# Patient Record
Sex: Female | Born: 1980 | Race: Black or African American | Hispanic: No | Marital: Single | Smoking: Current every day smoker
Health system: Southern US, Community
[De-identification: ages and names within clinical notes are randomized; demographics above are authoritative.]

## PROBLEM LIST (undated history)

## (undated) DIAGNOSIS — F419 Anxiety disorder, unspecified: Secondary | ICD-10-CM

## (undated) DIAGNOSIS — T7840XA Allergy, unspecified, initial encounter: Secondary | ICD-10-CM

## (undated) DIAGNOSIS — D649 Anemia, unspecified: Secondary | ICD-10-CM

## (undated) DIAGNOSIS — Z8041 Family history of malignant neoplasm of ovary: Secondary | ICD-10-CM

## (undated) HISTORY — DX: Family history of malignant neoplasm of ovary: Z80.41

## (undated) HISTORY — DX: Allergy, unspecified, initial encounter: T78.40XA

## (undated) HISTORY — DX: Anemia, unspecified: D64.9

## (undated) HISTORY — PX: TUBAL LIGATION: SHX77

## (undated) HISTORY — DX: Anxiety disorder, unspecified: F41.9

---

## 2008-01-10 ENCOUNTER — Ambulatory Visit: Payer: Self-pay | Admitting: Internal Medicine

## 2009-03-10 LAB — CONVERTED CEMR LAB: Pap Smear: NORMAL

## 2009-10-04 ENCOUNTER — Ambulatory Visit: Payer: Self-pay | Admitting: Family Medicine

## 2009-10-04 DIAGNOSIS — F17200 Nicotine dependence, unspecified, uncomplicated: Secondary | ICD-10-CM | POA: Insufficient documentation

## 2009-10-04 DIAGNOSIS — Z862 Personal history of diseases of the blood and blood-forming organs and certain disorders involving the immune mechanism: Secondary | ICD-10-CM | POA: Insufficient documentation

## 2009-10-21 LAB — CONVERTED CEMR LAB
ALT: 17 units/L (ref 0–35)
AST: 16 units/L (ref 0–37)
Albumin: 4 g/dL (ref 3.5–5.2)
Alkaline Phosphatase: 60 units/L (ref 39–117)
BUN: 9 mg/dL (ref 6–23)
Basophils Absolute: 0 10*3/uL (ref 0.0–0.1)
Chloride: 107 meq/L (ref 96–112)
Eosinophils Relative: 3.5 % (ref 0.0–5.0)
GFR calc non Af Amer: 131.68 mL/min (ref >60)
Glucose, Bld: 83 mg/dL (ref 70–99)
HCT: 38.6 % (ref 36.0–46.0)
Hemoglobin: 13 g/dL (ref 12.0–15.0)
LDL Cholesterol: 110 mg/dL — ABNORMAL HIGH (ref 0–99)
Lymphs Abs: 3.5 10*3/uL (ref 0.7–4.0)
Monocytes Absolute: 0.6 10*3/uL (ref 0.1–1.0)
Monocytes Relative: 8.6 % (ref 3.0–12.0)
Neutro Abs: 2.7 10*3/uL (ref 1.4–7.7)
Neutrophils Relative %: 38.3 % — ABNORMAL LOW (ref 43.0–77.0)
Platelets: 183 10*3/uL (ref 150.0–400.0)
Potassium: 4.1 meq/L (ref 3.5–5.1)
Sodium: 143 meq/L (ref 135–145)
Total Bilirubin: 0.2 mg/dL — ABNORMAL LOW (ref 0.3–1.2)
Total Protein: 7.3 g/dL (ref 6.0–8.3)
VLDL: 30 mg/dL (ref 0.0–40.0)
WBC: 7.1 10*3/uL (ref 4.5–10.5)

## 2009-11-23 ENCOUNTER — Ambulatory Visit: Payer: Self-pay | Admitting: Family Medicine

## 2010-01-08 ENCOUNTER — Emergency Department: Payer: Self-pay | Admitting: Emergency Medicine

## 2010-04-07 ENCOUNTER — Encounter: Payer: Self-pay | Admitting: Maternal and Fetal Medicine

## 2010-05-24 NOTE — Assessment & Plan Note (Signed)
Summary: NEW PT TO EST/CLE   Vital Signs:  Patient profile:   30 year old female Height:      70 inches Weight:      196.50 pounds BMI:     28.30 Temp:     98.3 degrees F oral Pulse rate:   80 / minute Pulse rhythm:   regular BP sitting:   120 / 60  (left arm) Cuff size:   regular  Vitals Entered By: Linde Gillis CMA Duncan Dull) (October 04, 2009 1:56 PM) CC: new patient   History of Present Illness: 30 yo here to establish care with no complaints.  Well woman-  has not been to primary doctor in years. Has been having yearly pap smears at [facility].  Sexually active with husband only, has one son.  Intested in contraception, other than OCPs.  They all made her nauseated. Tried Depo, gained too much weight. Currently using condoms.  Tobacco abuse- 1/2 ppd x 10 years.  Has never quit before.  Ready to quit to set a good example for her son and she does not want him around cigarette smoke.  Preventive Screening-Counseling & Management  Alcohol-Tobacco     Smoking Status: current  Caffeine-Diet-Exercise     Does Patient Exercise: yes      Drug Use:  no.    Current Medications (verified): 1)  Chantix Starting Month Pak 0.5 Mg X 11 & 1 Mg X 42  Misc (Varenicline Tartrate) .... 0.5mg  By Mouth Once Daily For 3 Days, Then Twice Daily For 4 Days and Then 1mg  By Mouth 2 Times Daily  Allergies (verified): No Known Drug Allergies  Past History:  Family History: Last updated: 10/04/2009 Family History High cholesterol Family History Hypertension  Social History: Last updated: 10/04/2009 Lives in [place] with son and husband. 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 Family History High cholesterol Family History Hypertension  Social History: Reviewed history and no changes required. Lives in [place] with son and husband. 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 Eyes:  Denies blurring. ENT:  Denies difficulty swallowing. CV:  Denies chest pain or discomfort. Resp:  Denies shortness of breath. GI:  Denies abdominal pain, bloody stools, and change in bowel habits. GU:  Denies abnormal vaginal bleeding, discharge, and dysuria. MS:  Denies joint pain, joint redness, and joint swelling. Derm:  Denies rash. Neuro:  Denies headaches. Psych:  Denies anxiety, depression, sense of great danger, suicidal thoughts/plans, thoughts of violence, unusual visions or sounds, and thoughts /plans of harming others. Endo:  Denies cold intolerance and heat intolerance.  Physical Exam  General:  Well-developed,well-nourished,in no acute distress; alert,appropriate and cooperative throughout examination, overweight Eyes:  vision grossly intact, pupils equal, and pupils round.   Ears:  R ear normal and L ear normal.   Nose:  no external deformity.   Mouth:  good dentition.   Neck:  No deformities, masses, or tenderness noted. Lungs:  Normal respiratory effort, chest expands symmetrically. Lungs are clear to auscultation, no crackles or wheezes.  Heart:  Normal rate and regular rhythm. S1 and S2 normal without gallop, murmur, click, rub or other extra sounds. Abdomen:  Bowel sounds positive,abdomen soft and non-tender without masses, organomegaly or hernias noted. Msk:  No deformity or scoliosis noted of thoracic or lumbar spine.   Extremities:  No clubbing, cyanosis, edema, or deformity noted with normal full range of motion of all joints.     Neurologic:  alert & oriented X3 and gait normal.   Skin:  Intact without suspicious lesions or rashes Psych:  Cognition and judgment appear intact. Alert and cooperative with normal attention span and concentration. No apparent delusions, illusions, hallucinations   Impression & Recommendations:  Problem # 1:  HEALTH MAINTENANCE EXAM (ICD-V70.0) Assessment Unchanged Reviewed preventive care protocols, scheduled due services, and updated immunizations Discussed nutrition, exercise, diet, and healthy lifestyle.  BMET, FLP, hepatic panel today.    Orders: Venipuncture (84696) TLB-BMP (Basic Metabolic Panel-BMET) (80048-METABOL)  Problem # 2:  ANEMIA, HX OF (ICD-V12.3) check CBC today. Orders: Venipuncture (29528) TLB-CBC Platelet - w/Differential (85025-CBCD)  Problem # 3:  CONTRACEPTIVE MANAGEMENT (ICD-V25.09) Assessment: Unchanged Given handout for Mirena, she and her husband will discuss.  Problem # 4:  TOBACCO ABUSE (ICD-305.1) Assessment: Unchanged Ready to quit! Given prescription for chantix along with chantix booklet.  Follow up in 1 month. Her updated medication list for this problem includes:    Chantix Starting Month Pak 0.5 Mg X 11 & 1 Mg X 42 Misc (Varenicline tartrate) .Marland Kitchen... 0.5mg  by mouth once daily for 3 days, then twice daily for 4 days and then 1mg  by mouth 2 times daily  Complete Medication List: 1)  Chantix Starting Month Pak 0.5 Mg X 11 & 1 Mg X 42 Misc (Varenicline tartrate) .... 0.5mg  by mouth once daily for 3 days, then twice daily for 4 days and then 1mg  by mouth 2 times daily  Other Orders: TLB-Lipid Panel (80061-LIPID) TLB-Hepatic/Liver Function Pnl (80076-HEPATIC)  Patient Instructions: 1)  It was so nice to meet you. 2)  Please come back to see me in one month.  3)  Stop smoking tips: Choose a quit date. Cut down before the quit date. Decide what you will do as a substitute when you feel the urge to smoke(gum, toothpick, exercise).   Prescriptions: CHANTIX STARTING MONTH PAK 0.5 MG X 11 & 1 MG X 42  MISC (VARENICLINE TARTRATE) 0.5mg  by mouth once daily for 3 days, then twice daily for 4 days and then 1mg  by mouth 2 times daily  #1 pack x 0   Entered and Authorized by:   Ruthe Mannan MD   Signed by:   Ruthe Mannan MD on 10/04/2009   Method used:   Print then Give to Patient   RxID:   [id number]   Prior Medications (reviewed today): None Current Allergies (reviewed today): No known allergies   PAP Result Date:  03/10/2009 PAP Result:  normal PAP Next Due:  2 yr

## 2010-07-26 ENCOUNTER — Inpatient Hospital Stay: Payer: Self-pay | Admitting: Obstetrics and Gynecology

## 2010-08-02 LAB — PATHOLOGY REPORT

## 2010-12-28 ENCOUNTER — Encounter: Payer: Self-pay | Admitting: General Practice

## 2011-01-23 ENCOUNTER — Encounter: Payer: Self-pay | Admitting: General Practice

## 2013-06-25 ENCOUNTER — Ambulatory Visit: Payer: Self-pay | Admitting: Family Medicine

## 2014-12-31 ENCOUNTER — Encounter: Payer: Self-pay | Admitting: Family Medicine

## 2014-12-31 ENCOUNTER — Ambulatory Visit (INDEPENDENT_AMBULATORY_CARE_PROVIDER_SITE_OTHER): Payer: 59 | Admitting: Family Medicine

## 2014-12-31 VITALS — BP 116/72 | HR 79 | Temp 98.3°F | Resp 16 | Ht 69.0 in | Wt 208.8 lb

## 2014-12-31 DIAGNOSIS — I10 Essential (primary) hypertension: Secondary | ICD-10-CM | POA: Insufficient documentation

## 2014-12-31 DIAGNOSIS — IMO0002 Reserved for concepts with insufficient information to code with codable children: Secondary | ICD-10-CM | POA: Insufficient documentation

## 2014-12-31 DIAGNOSIS — S161XXA Strain of muscle, fascia and tendon at neck level, initial encounter: Secondary | ICD-10-CM | POA: Diagnosis not present

## 2014-12-31 DIAGNOSIS — A5901 Trichomonal vulvovaginitis: Secondary | ICD-10-CM | POA: Insufficient documentation

## 2014-12-31 MED ORDER — MELOXICAM 15 MG PO TABS: 15.0000 mg | ORAL_TABLET | Freq: Every day | ORAL | Status: DC

## 2014-12-31 NOTE — Patient Instructions (Signed)
Cold compresses for 20 minutes on your neck after work.

## 2014-12-31 NOTE — Progress Notes (Signed)
Subjective:     Patient ID: Anita Lynch, female   DOB: 02/07/1981, 34 y.o.   MRN: [id number]  HPI  Chief Complaint  Patient presents with  . Neck Pain    Patient comes in office today with concerns of pain on her right side of her neck. Patient describes pain as a dull ache that radiates down her arm, patient report symptom began this morning. Patient states yesterday she hand numbness and tingling in her right hand that has not subsided.   Now reports persistent numbness from her right posterior neck to her first and second fingers. Reports she first noticed this while combing her daughter's hair yesterday. Denis prior neck injury or surgery.   Review of Systems  Constitutional: Negative for fever and chills.       Objective:   Physical Exam  Constitutional: She appears well-developed and well-nourished. She appears distressed.  Musculoskeletal:  Grip/EE/EF/shoulder strength 5/5.Cervical and shoulder FROM but increased upper shoulder pain with internal/external rotation. Mild tenderness over her right posterior cervical/upper trapezius area       Assessment:    1. Cervical strain, initial encounter - meloxicam (MOBIC) 15 MG tablet; Take 1 tablet (15 mg total) by mouth daily.  Dispense: 15 tablet; Refill: 0    Plan:    Cold compresses after her work day. Consider imaging/prednisone if not improving.

## 2015-11-15 ENCOUNTER — Ambulatory Visit: Payer: Self-pay | Admitting: Registered Nurse

## 2015-11-15 DIAGNOSIS — K047 Periapical abscess without sinus: Secondary | ICD-10-CM

## 2015-11-15 DIAGNOSIS — S025XXB Fracture of tooth (traumatic), initial encounter for open fracture: Secondary | ICD-10-CM

## 2015-11-15 MED ORDER — AMOXICILLIN-POT CLAVULANATE 875-125 MG PO TABS: 1.0000 | ORAL_TABLET | Freq: Two times a day (BID) | ORAL | 0 refills | Status: AC

## 2015-11-15 NOTE — Progress Notes (Signed)
Subjective:    Patient ID: Anita Lynch, female    DOB: [birth year], 35 y.o.   MRN: [id number]  Single african [place] female here for evaluation of swelling and drainage around upper left incisor/premolar started this weekend; tooth fractured, bad taste in mouth, gums hot, cheek swollen   Abscess  This is a new problem. The current episode started in the past 7 days. The problem occurs constantly. The problem has been waxing and waning. Pertinent negatives include no abdominal pain, anorexia, arthralgias, change in bowel habit, chest pain, chills, congestion, coughing, diaphoresis, fatigue, fever, headaches, joint swelling, myalgias, nausea, neck pain, numbness, rash, sore throat, swollen glands, urinary symptoms, vertigo, visual change, vomiting or weakness. The symptoms are aggravated by eating. She has tried eating and heat for the symptoms. The treatment provided mild relief.      Review of Systems  Constitutional: Negative for chills, diaphoresis, fatigue and fever.  HENT: Positive for dental problem, ear pain, facial swelling and mouth sores. Negative for congestion, drooling, ear discharge, hearing loss, nosebleeds, postnasal drip, rhinorrhea, sinus pressure, sneezing, sore throat, tinnitus, trouble swallowing and voice change.   Eyes: Negative for photophobia, pain, discharge, redness, itching and visual disturbance.  Respiratory: Negative for cough.   Cardiovascular: Negative for chest pain.  Gastrointestinal: Negative for abdominal pain, anorexia, change in bowel habit, nausea and vomiting.  Endocrine: Negative for polydipsia, polyphagia and polyuria.  Genitourinary: Negative for difficulty urinating.  Musculoskeletal: Negative for arthralgias, joint swelling, myalgias, neck pain and neck stiffness.  Skin: Negative for color change, pallor, rash and wound.  Allergic/Immunologic: Positive for environmental allergies. Negative for food allergies.  Neurological: Negative for  dizziness, vertigo, tremors, seizures, syncope, facial asymmetry, speech difficulty, weakness, light-headedness, numbness and headaches.  Hematological: Negative for adenopathy. Does not bruise/bleed easily.  Psychiatric/Behavioral: Negative for sleep disturbance.       Objective:   Physical Exam  Constitutional: She is oriented to person, place, and time. She appears well-developed and well-nourished. She is active and cooperative.  Non-toxic appearance. She does not have a sickly appearance. She does not appear ill. No distress.  HENT:  Head: Normocephalic and atraumatic.  Right Ear: Hearing, external ear and ear canal normal. A middle ear effusion is present.  Left Ear: Hearing, external ear and ear canal normal. A middle ear effusion is present.  Nose: Mucosal edema and rhinorrhea present. No nose lacerations, sinus tenderness, nasal deformity, septal deviation or nasal septal hematoma. No epistaxis.  No foreign bodies. Right sinus exhibits no maxillary sinus tenderness and no frontal sinus tenderness. Left sinus exhibits no maxillary sinus tenderness and no frontal sinus tenderness.  Mouth/Throat: Uvula is midline and mucous membranes are normal. Mucous membranes are not pale, not dry and not cyanotic. She does not have dentures. No oral lesions. No trismus in the jaw. Normal dentition. Dental abscesses and dental caries present. No uvula swelling or lacerations. Posterior oropharyngeal edema and posterior oropharyngeal erythema present. No oropharyngeal exudate or tonsillar abscesses.    Cobblestoning posterior pharynx; bilateral TMs with air fluid level clear; bilateral nasal turbinates edema/erythema clear discharge; slight swelling left cheek 1+/4 nonpitting  Eyes: Conjunctivae, EOM and lids are normal. Pupils are equal, round, and reactive to light. Right eye exhibits no chemosis, no discharge, no exudate and no hordeolum. No foreign body present in the right eye. Left eye exhibits no  chemosis, no discharge, no exudate and no hordeolum. No foreign body present in the left eye. Right conjunctiva is not injected. Right conjunctiva  has no hemorrhage. Left conjunctiva is not injected. Left conjunctiva has no hemorrhage. No scleral icterus. Right eye exhibits normal extraocular motion and no nystagmus. Left eye exhibits normal extraocular motion and no nystagmus. Right pupil is round and reactive. Left pupil is round and reactive. Pupils are equal.  Neck: Trachea normal and normal range of motion. Neck supple. No tracheal tenderness, no spinous process tenderness and no muscular tenderness present. No neck rigidity. No tracheal deviation, no edema, no erythema and normal range of motion present. No thyroid mass and no thyromegaly present.  Cardiovascular: Normal rate, regular rhythm, S1 normal, S2 normal, normal heart sounds and intact distal pulses.  PMI is not displaced.  Exam reveals no gallop and no friction rub.   No murmur heard. Pulmonary/Chest: Effort normal and breath sounds normal. No accessory muscle usage or stridor. No respiratory distress. She has no decreased breath sounds. She has no wheezes. She has no rhonchi. She has no rales. She exhibits no tenderness.  Abdominal: Soft. Normal appearance. She exhibits no distension.  Musculoskeletal: Normal range of motion. She exhibits no edema or tenderness.       Right shoulder: Normal.       Left shoulder: Normal.       Right hip: Normal.       Left hip: Normal.       Right knee: Normal.       Left knee: Normal.       Cervical back: Normal.       Right hand: Normal.       Left hand: Normal.  Lymphadenopathy:       Head (right side): No submental, no submandibular, no tonsillar, no preauricular, no posterior auricular and no occipital adenopathy present.       Head (left side): No submental, no submandibular, no tonsillar, no preauricular, no posterior auricular and no occipital adenopathy present.    She has no cervical  adenopathy.       Right cervical: No superficial cervical, no deep cervical and no posterior cervical adenopathy present.      Left cervical: No superficial cervical, no deep cervical and no posterior cervical adenopathy present.  Neurological: She is alert and oriented to person, place, and time. She has normal strength. She is not disoriented. She displays no atrophy and no tremor. No cranial nerve deficit or sensory deficit. She exhibits normal muscle tone. She displays no seizure activity. Coordination and gait normal. GCS eye subscore is 4. GCS verbal subscore is 5. GCS motor subscore is 6.  Skin: Skin is warm, dry and intact. No abrasion, no bruising, no burn, no ecchymosis, no laceration, no lesion, no petechiae and no rash noted. She is not diaphoretic. No cyanosis or erythema. No pallor. Nails show no clubbing.  Psychiatric: She has a normal mood and affect. Her speech is normal and behavior is normal. Judgment and thought content normal. Cognition and memory are normal.  Nursing note and vitals reviewed.         Assessment & Plan:  A-tooth fracture, dental abscess  P-Take antibiotic as ordered augmentin 875mg  po BID x 10 days.  May take mobic 15mg  po daily she has at home OR OTC NSAID po TID/QID  Not both within 24 hours of each other.. Salt water gargles po prn.  Good dental hygiene brushing, flossing, listerine equivalent mouthwash BID.  Follow up with PCM/dentist if no improvement or worsening of symptoms e.g. fever, chills, dyspnea, SOB, purulent discharge, neck pain, eye pain.  Patient  verbalized understanding of information/instructions agreed with plan of care and had no further questions at this time.

## 2016-05-11 ENCOUNTER — Emergency Department
Admission: EM | Admit: 2016-05-11 | Discharge: 2016-05-11 | Disposition: A | Discharge status: Home / Self Care | Payer: 59 | Attending: Emergency Medicine | Admitting: Emergency Medicine

## 2016-05-11 ENCOUNTER — Encounter: Payer: Self-pay | Admitting: Emergency Medicine

## 2016-05-11 DIAGNOSIS — M62830 Muscle spasm of back: Secondary | ICD-10-CM | POA: Insufficient documentation

## 2016-05-11 DIAGNOSIS — Y999 Unspecified external cause status: Secondary | ICD-10-CM | POA: Diagnosis not present

## 2016-05-11 DIAGNOSIS — F17210 Nicotine dependence, cigarettes, uncomplicated: Secondary | ICD-10-CM | POA: Insufficient documentation

## 2016-05-11 DIAGNOSIS — Y9389 Activity, other specified: Secondary | ICD-10-CM | POA: Insufficient documentation

## 2016-05-11 DIAGNOSIS — Y929 Unspecified place or not applicable: Secondary | ICD-10-CM | POA: Insufficient documentation

## 2016-05-11 DIAGNOSIS — Z791 Long term (current) use of non-steroidal anti-inflammatories (NSAID): Secondary | ICD-10-CM | POA: Insufficient documentation

## 2016-05-11 DIAGNOSIS — X501XXA Overexertion from prolonged static or awkward postures, initial encounter: Secondary | ICD-10-CM | POA: Insufficient documentation

## 2016-05-11 DIAGNOSIS — S3992XA Unspecified injury of lower back, initial encounter: Secondary | ICD-10-CM | POA: Diagnosis present

## 2016-05-11 MED ORDER — NAPROXEN 500 MG PO TABS
500.0000 mg | ORAL_TABLET | Freq: Two times a day (BID) | ORAL | 0 refills | Status: DC
Start: 2016-05-11 — End: 2017-05-02

## 2016-05-11 MED ORDER — TIZANIDINE HCL 4 MG PO TABS
4.0000 mg | ORAL_TABLET | Freq: Three times a day (TID) | ORAL | 0 refills | Status: DC
Start: 2016-05-11 — End: 2017-05-02

## 2016-05-11 MED ORDER — ORPHENADRINE CITRATE 30 MG/ML IJ SOLN
60.0000 mg | Freq: Two times a day (BID) | INTRAMUSCULAR | Status: DC
  Administered 2016-05-11: 60 mg via INTRAMUSCULAR
  Filled 2016-05-11: qty 2

## 2016-05-11 MED ORDER — NAPROXEN 500 MG PO TABS
500.0000 mg | ORAL_TABLET | Freq: Once | ORAL | Status: AC
  Administered 2016-05-11: 500 mg via ORAL
  Filled 2016-05-11: qty 1

## 2016-05-11 NOTE — ED Triage Notes (Signed)
Pt presents to ED with c/o back pain. Pt states yesterday she bent down to get a shirt "and couldn't really get back up". Pt is ambulatory to the triage room with no difficulty at this time. NAD noted A & O x 4.

## 2016-05-11 NOTE — ED Notes (Signed)
Patient c/o lower right back pain radiating down right leg and to abdomen. Pt denies changes to urination. Patient she was leaning down when pain began picking up a Tshirt. Pt reports she took 1000 mg of ibuprofen at approx 1900 today with no relief of pain

## 2016-05-11 NOTE — ED Provider Notes (Signed)
[facility] Emergency Department Provider Note ____________________________________________  Time seen: Approximately 8:29 PM  I have reviewed the triage vital signs and the nursing notes.   HISTORY  Chief Complaint Back Pain    HPI Anita Lynch is a 36 y.o. female who presents to the emergency department for evaluation of right lower back pain with radiation into the right leg. He states that she bent over to pick something up out of the floor and had immediate pain and difficulty attempting to stand up. She took 1000 mg of ibuprofen at about 7 PM without any relief.  History reviewed. No pertinent past medical history.  Patient Active Problem List   Diagnosis Date Noted  . 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 Problems influencing health status 12/31/2014  . High blood pressure 12/31/2014  . TOBACCO ABUSE 10/04/2009  . ANEMIA, HX OF 10/04/2009    Past Surgical History:  Procedure Laterality Date  . CESAREAN SECTION  [phone]    Prior to Admission medications   Medication Sig Start Date End Date Taking? Authorizing Provider  meloxicam (MOBIC) 15 MG tablet Take 1 tablet (15 mg total) by mouth daily. 12/31/14   Carmon Ginsberg, PA  naproxen (NAPROSYN) 500 MG tablet Take 1 tablet (500 mg total) by mouth 2 (two) times daily with a meal. 05/11/16   Dierre Crevier B Analyce Tavares, FNP  tiZANidine (ZANAFLEX) 4 MG tablet Take 1 tablet (4 mg total) by mouth 3 (three) times daily. 05/11/16   Victorino Dike, FNP    Allergies Bee venom and Shellfish allergy  Family History  Problem Relation Age of Onset  . Hyperlipidemia Father   . Hypertension Father   . Lupus Sister   . Cancer Maternal Grandmother     breast  . Diabetes Paternal Grandmother   . Dementia Paternal Grandfather   . Alzheimer's disease Paternal Grandfather     Social History Social History  Substance Use Topics  . Smoking status: Current Every Day Smoker    Types: Cigarettes  . Smokeless tobacco:  Not on file  . Alcohol use No    Review of Systems Constitutional: No recent illness. Cardiovascular: Denies chest pain or palpitations. Respiratory: Denies shortness of breath. Musculoskeletal: Pain in Right lower back pain with radiation into the posterior right lower extremity and some radiation over into the right side of the abdomen. Skin: Negative for rash, wound, lesion. Neurological: Negative for focal weakness or numbness. Negative for loss of bowel or bladder control  ____________________________________________   PHYSICAL EXAM:  VITAL SIGNS: ED Triage Vitals  Enc Vitals Group     BP 05/11/16 1948 (!) 148/86     Pulse Rate 05/11/16 1948 87     Resp 05/11/16 1948 18     Temp 05/11/16 1948 97.9 F (36.6 C)     Temp Source 05/11/16 1948 Oral     SpO2 05/11/16 1948 100 %     Weight 05/11/16 1948 202 lb (91.6 kg)     Height 05/11/16 1948 5\' 9"  (1.753 m)     Head Circumference --      Peak Flow --      Pain Score 05/11/16 1949 7     Pain Loc --      Pain Edu? --      Excl. in [place]? --     Constitutional: Alert and oriented. Well appearing and in no acute distress. Eyes: Conjunctivae are normal. EOMI. Head: Atraumatic. Neck: No stridor.  Respiratory: Normal respiratory effort.  Musculoskeletal: Negative straight leg raise. Limited flexion. Full extension possible without pain. Neurologic:  Normal speech and language. No gross focal neurologic deficits are appreciated. Speech is normal. No gait instability. Skin:  Skin is warm, dry and intact. Atraumatic. Psychiatric: Mood and affect are normal. Speech and behavior are normal.  ____________________________________________   LABS (all labs ordered are listed, but only abnormal results are displayed)  Labs Reviewed - No data to display ____________________________________________  RADIOLOGY  Not indicated ____________________________________________   PROCEDURES  Procedure(s) performed:  None   ____________________________________________   INITIAL IMPRESSION / ASSESSMENT AND PLAN / ED COURSE     Pertinent labs & imaging results that were available during my care of the patient were reviewed by me and considered in my medical decision making (see chart for details).  36 year old female presenting to the emergency department for acute lumbar pain with radiation into the right lower extremity. While in the emergency department tonight, she was treated with Norflex and Naprosyn with significant relief. She was given prescriptions for Naprosyn and tizanidine. She was encouraged to follow up with her primary care provider for symptoms that are not improving over the next 2 days. She is also encouraged to return to the emergency department for symptoms that change or worsen if some unable schedule an appointment. ____________________________________________   FINAL CLINICAL IMPRESSION(S) / ED DIAGNOSES  Final diagnoses:  Muscle spasm of back       Victorino Dike, FNP 05/16/16 [place], MD 05/16/16 1447

## 2016-05-11 NOTE — Discharge Instructions (Signed)
Follow-up with your primary care provider for symptoms that are not improving over the next few days. Return to the emergency department for symptoms that change or worsen if you are unable to schedule an appointment.

## 2016-08-10 ENCOUNTER — Encounter: Payer: Self-pay | Admitting: Obstetrics & Gynecology

## 2016-08-10 ENCOUNTER — Ambulatory Visit (INDEPENDENT_AMBULATORY_CARE_PROVIDER_SITE_OTHER): Payer: 59 | Admitting: Obstetrics & Gynecology

## 2016-08-10 VITALS — BP 122/78 | HR 85 | Ht 69.0 in | Wt 216.0 lb

## 2016-08-10 DIAGNOSIS — Z13220 Encounter for screening for lipoid disorders: Secondary | ICD-10-CM

## 2016-08-10 DIAGNOSIS — Z0000 Encounter for general adult medical examination without abnormal findings: Secondary | ICD-10-CM

## 2016-08-10 DIAGNOSIS — Z1321 Encounter for screening for nutritional disorder: Secondary | ICD-10-CM

## 2016-08-10 DIAGNOSIS — Z131 Encounter for screening for diabetes mellitus: Secondary | ICD-10-CM | POA: Diagnosis not present

## 2016-08-10 DIAGNOSIS — G4489 Other headache syndrome: Secondary | ICD-10-CM | POA: Diagnosis not present

## 2016-08-10 DIAGNOSIS — Z1329 Encounter for screening for other suspected endocrine disorder: Secondary | ICD-10-CM | POA: Diagnosis not present

## 2016-08-10 MED ORDER — MEDROXYPROGESTERONE ACETATE 150 MG/ML IM SUSP: 150.0000 mg | INTRAMUSCULAR | 3 refills | Status: DC

## 2016-08-10 NOTE — Progress Notes (Signed)
HPI:      [name] is a 36 y.o. [id number] who LMP was Patient's last menstrual period was 07/25/2016., she presents today for her annual examination. The patient has no complaints today. The patient is sexually active. Her last pap: was normal and it was 3 years ago. The patient does perform self breast exams.  There is notable family history of breast or ovarian cancer in her family.  The patient has regular exercise: yes.  The patient denies current symptoms of depression.  Also c/o cyclcical pre-menstrual headache, no other neurologic signs.  GYN History: Contraception: tubal ligation  PMHx: No past medical history on file. Past Surgical History:  Procedure Laterality Date  . CESAREAN SECTION  2002,2012   Family History  Problem Relation Age of Onset  . Hyperlipidemia Father   . Hypertension Father   . Lupus Sister   . Cancer Maternal Grandmother     breast  . Diabetes Paternal Grandmother   . Dementia Paternal Grandfather   . Alzheimer's disease Paternal Grandfather    Social History  Substance Use Topics  . Smoking status: Current Every Day Smoker    Types: Cigarettes  . Smokeless tobacco: Never Used  . Alcohol use No    Current Outpatient Prescriptions:  .  medroxyPROGESTERone (DEPO-PROVERA) 150 MG/ML injection, Inject 1 mL (150 mg total) into the muscle every 3 (three) months., Disp: 1 mL, Rfl: 3 .  meloxicam (MOBIC) 15 MG tablet, Take 1 tablet (15 mg total) by mouth daily. (Patient not taking: Reported on 08/10/2016), Disp: 15 tablet, Rfl: 0 .  naproxen (NAPROSYN) 500 MG tablet, Take 1 tablet (500 mg total) by mouth 2 (two) times daily with a meal. (Patient not taking: Reported on 08/10/2016), Disp: 30 tablet, Rfl: 0 .  tiZANidine (ZANAFLEX) 4 MG tablet, Take 1 tablet (4 mg total) by mouth 3 (three) times daily. (Patient not taking: Reported on 08/10/2016), Disp: 30 tablet, Rfl: 0 Allergies: Bee venom and Shellfish allergy  Review of Systems    Constitutional: Negative for chills, fever and malaise/fatigue.  HENT: Negative for congestion, sinus pain and sore throat.   Eyes: Negative for blurred vision and pain.  Respiratory: Negative for cough and wheezing.   Cardiovascular: Negative for chest pain and leg swelling.  Gastrointestinal: Negative for abdominal pain, constipation, diarrhea, heartburn, nausea and vomiting.  Genitourinary: Negative for dysuria, frequency, hematuria and urgency.  Musculoskeletal: Negative for back pain, joint pain, myalgias and neck pain.  Skin: Negative for itching and rash.  Neurological: Negative for dizziness, tremors and weakness.  Endo/Heme/Allergies: Does not bruise/bleed easily.  Psychiatric/Behavioral: Negative for depression. The patient is not nervous/anxious and does not have insomnia.     Objective: BP 122/78   Pulse 85   Ht 5\' 9"  (1.753 m)   Wt 216 lb (98 kg)   LMP 07/25/2016   BMI 31.90 kg/m   Filed Weights   08/10/16 1008  Weight: 216 lb (98 kg)   Body mass index is 31.9 kg/m. Physical Exam  Constitutional: She is oriented to person, place, and time. She appears well-developed and well-nourished. No distress.  Genitourinary: Rectum normal, vagina normal and uterus normal. Pelvic exam was performed with patient supine. There is no rash or lesion on the right labia. There is no rash or lesion on the left labia. Vagina exhibits no lesion. No bleeding in the vagina. Right adnexum does not display mass and does not display tenderness. Left adnexum does not display mass and does not  display tenderness. Cervix does not exhibit motion tenderness, lesion, friability or polyp.   Uterus is mobile and midaxial. Uterus is not enlarged or exhibiting a mass.  HENT:  Head: Normocephalic and atraumatic. Head is without laceration.  Right Ear: Hearing normal.  Left Ear: Hearing normal.  Nose: No epistaxis.  No foreign bodies.  Mouth/Throat: Uvula is midline, oropharynx is clear and moist and  mucous membranes are normal.  Eyes: Pupils are equal, round, and reactive to light.  Neck: Normal range of motion. Neck supple. No thyromegaly present.  Cardiovascular: Normal rate and regular rhythm.  Exam reveals no gallop and no friction rub.   No murmur heard. Pulmonary/Chest: Effort normal and breath sounds normal. No respiratory distress. She has no wheezes. Right breast exhibits no mass, no skin change and no tenderness. Left breast exhibits no mass, no skin change and no tenderness.  Abdominal: Soft. Bowel sounds are normal. She exhibits no distension. There is no tenderness. There is no rebound.  Musculoskeletal: Normal range of motion.  Neurological: She is alert and oriented to person, place, and time. No cranial nerve deficit.  Skin: Skin is warm and dry.  Psychiatric: She has a normal mood and affect. Judgment normal.  Vitals reviewed.   Assessment:  ANNUAL EXAM 1. Annual physical exam   2. Headache associated with hormonal factors   3. Screening for diabetes mellitus   4. Screening for thyroid disorder   5. Screening for cholesterol level   6. Encounter for vitamin deficiency screening      Screening Plan:            1.  Cervical Screening-  Pap smear done today  2. Breast screening- Exam annually and mammogram>40 planned   3. Colonoscopy every 10 years, Hemoccult testing - after age 35  4. Labs Ordered today  5. Counseling for contraception: has BTL, but wants hormones to help w cyclical headaches  Other:  1. Annual physical exam - IGP, Aptima HPV  2. Headache associated with hormonal factors - medroxyPROGESTERone (DEPO-PROVERA) 150 MG/ML injection; Inject 1 mL (150 mg total) into the muscle every 3 (three) months.  Dispense: 1 mL; Refill: 3 - pros and cons and SE discussed  3. Screening for diabetes mellitus - Hemoglobin A1c; Future  4. Screening for thyroid disorder - TSH; Future  5. Screening for cholesterol level - Lipid panel; Future  6.  Encounter for vitamin deficiency screening - Vitamin D (25 hydroxy); Future    F/U  Return in about 1 year (around 08/10/2017) for Annual.  Soon for labs and Depo shot  Barnett Applebaum, MD, [facility] 08/10/2016  10:49 AM

## 2016-08-10 NOTE — Patient Instructions (Signed)

## 2016-08-14 ENCOUNTER — Ambulatory Visit (INDEPENDENT_AMBULATORY_CARE_PROVIDER_SITE_OTHER): Payer: 59

## 2016-08-14 ENCOUNTER — Other Ambulatory Visit: Payer: 59

## 2016-08-14 DIAGNOSIS — Z1329 Encounter for screening for other suspected endocrine disorder: Secondary | ICD-10-CM

## 2016-08-14 DIAGNOSIS — Z13220 Encounter for screening for lipoid disorders: Secondary | ICD-10-CM

## 2016-08-14 DIAGNOSIS — G43839 Menstrual migraine, intractable, without status migrainosus: Secondary | ICD-10-CM

## 2016-08-14 DIAGNOSIS — Z3041 Encounter for surveillance of contraceptive pills: Secondary | ICD-10-CM

## 2016-08-14 DIAGNOSIS — Z131 Encounter for screening for diabetes mellitus: Secondary | ICD-10-CM

## 2016-08-14 DIAGNOSIS — Z1321 Encounter for screening for nutritional disorder: Secondary | ICD-10-CM | POA: Diagnosis not present

## 2016-08-14 LAB — IGP, APTIMA HPV
HPV Aptima: NEGATIVE
PAP SMEAR COMMENT: 0

## 2016-08-14 MED ORDER — MEDROXYPROGESTERONE ACETATE 150 MG/ML IM SUSP
150.0000 mg | Freq: Once | INTRAMUSCULAR | Status: AC
  Administered 2016-08-14: 150 mg via INTRAMUSCULAR

## 2016-08-14 NOTE — Progress Notes (Signed)
NDC#[address]-2 

## 2016-08-15 ENCOUNTER — Encounter: Payer: Self-pay | Admitting: Obstetrics & Gynecology

## 2016-08-15 LAB — LIPID PANEL
Chol/HDL Ratio: 5.1 ratio — ABNORMAL HIGH (ref 0.0–4.4)
Cholesterol, Total: 154 mg/dL (ref 100–199)
HDL: 30 mg/dL — AB (ref >39)
LDL Calculated: 96 mg/dL (ref 0–99)
TRIGLYCERIDES: 138 mg/dL (ref 0–149)
VLDL Cholesterol Cal: 28 mg/dL (ref 5–40)

## 2016-08-15 LAB — VITAMIN D 25 HYDROXY (VIT D DEFICIENCY, FRACTURES): VIT D 25 HYDROXY: 16.9 ng/mL — AB (ref 30.0–100.0)

## 2016-08-15 LAB — TSH: TSH: 1.25 u[IU]/mL (ref 0.450–4.500)

## 2016-08-15 LAB — HEMOGLOBIN A1C
Est. average glucose Bld gHb Est-mCnc: 111 mg/dL
Hgb A1c MFr Bld: 5.5 % (ref 4.8–5.6)

## 2016-09-06 ENCOUNTER — Encounter: Payer: Self-pay | Admitting: Obstetrics & Gynecology

## 2016-09-11 ENCOUNTER — Ambulatory Visit: Payer: Self-pay | Admitting: Physician Assistant

## 2016-09-11 ENCOUNTER — Encounter: Payer: Self-pay | Admitting: Physician Assistant

## 2016-09-11 VITALS — BP 130/80 | HR 102 | Temp 98.8°F

## 2016-09-11 DIAGNOSIS — J209 Acute bronchitis, unspecified: Secondary | ICD-10-CM

## 2016-09-11 MED ORDER — IPRATROPIUM-ALBUTEROL 0.5-2.5 (3) MG/3ML IN SOLN: 3.0000 mL | Freq: Once | RESPIRATORY_TRACT | Status: DC

## 2016-09-11 MED ORDER — METHYLPREDNISOLONE 4 MG PO TBPK: ORAL_TABLET | ORAL | 0 refills | Status: DC

## 2016-09-11 MED ORDER — ALBUTEROL SULFATE HFA 108 (90 BASE) MCG/ACT IN AERS: 2.0000 | INHALATION_SPRAY | Freq: Four times a day (QID) | RESPIRATORY_TRACT | 0 refills | Status: DC | PRN

## 2016-09-11 MED ORDER — AZITHROMYCIN 250 MG PO TABS: ORAL_TABLET | ORAL | 0 refills | Status: DC

## 2016-09-11 NOTE — Progress Notes (Signed)
S: C/o cough and congestion with wheezing and chest pain, chest is sore from coughing, denies fever, chills, some white mucus but mainly cough is dry and hacking; ribs hurt from coughing, keeping pt awake at night;  denies cardiac type chest pain or sob, v/d, abd pain Remainder ros neg  O: vitals wnl, nad, tms clear, throat injected, neck supple no lymph, lungs with wheezing, cv rrr, neuro intact, svn duoneb given in clinic, air movement improved  A:  Acute bronchitis   P:  rx medication: medrol dose pack, zpack, albuterol inhaler, smoking cessation, use otc meds, tylenol or motrin as needed for fever/chills, return if not better in 3 -5 days, return earlier if worsening

## 2016-11-06 ENCOUNTER — Ambulatory Visit (INDEPENDENT_AMBULATORY_CARE_PROVIDER_SITE_OTHER): Payer: 59

## 2016-11-06 DIAGNOSIS — Z3042 Encounter for surveillance of injectable contraceptive: Secondary | ICD-10-CM | POA: Diagnosis not present

## 2016-11-06 MED ORDER — MEDROXYPROGESTERONE ACETATE 150 MG/ML IM SUSP
150.0000 mg | Freq: Once | INTRAMUSCULAR | Status: AC
  Administered 2016-11-06: 150 mg via INTRAMUSCULAR

## 2017-01-29 ENCOUNTER — Ambulatory Visit (INDEPENDENT_AMBULATORY_CARE_PROVIDER_SITE_OTHER): Payer: 59

## 2017-01-29 DIAGNOSIS — Z3042 Encounter for surveillance of injectable contraceptive: Secondary | ICD-10-CM

## 2017-01-29 MED ORDER — MEDROXYPROGESTERONE ACETATE 150 MG/ML IM SUSP
150.0000 mg | Freq: Once | INTRAMUSCULAR | Status: AC
  Administered 2017-01-29: 150 mg via INTRAMUSCULAR

## 2017-04-18 ENCOUNTER — Ambulatory Visit: Payer: Self-pay | Admitting: Emergency Medicine

## 2017-04-18 VITALS — BP 130/80 | HR 106 | Temp 98.7°F

## 2017-04-18 DIAGNOSIS — W57XXXA Bitten or stung by nonvenomous insect and other nonvenomous arthropods, initial encounter: Secondary | ICD-10-CM

## 2017-04-18 NOTE — Progress Notes (Signed)
Subjective. Approximate 2-1/2 weeks ago patient noted bite-like areas on the left upper abdomen. Since that time she has developed tender bite-like areas over her left deltoid and upper arm. Her 36-year-old child at home and husband do not have any bite-like areas. 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 She denies any lesions of her hands and feet. She is not ill. All of the areas have a small blister at the center. Objective. There are 4 approximate 1 x 1 cm firm areas with central puncture site over the left deltoid. There are also left upper abdomen are very similar lesions present. The rest of her scan is normal. There are no lesions between the fingers are between the toes or over her shins. Assessment. The patient definitely appears to have bite-like areas over her left shoulder and abdomen. These do not itch at night but they are warm to touch. They are in an unusual distribution to be scabies. It also is unusual because her family members do not have any lesions. 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 Plan. We'll go ahead and treat with Elimite tonight and repeat in one week. Her daughter will also be treated. She will check the patient's rooms thoroughly to see if she sees any signs of bedbugs or scabies. They may have to have an inspector go to the homes and check them to see if there is any evidence of bedbugs infestation.

## 2017-04-23 ENCOUNTER — Ambulatory Visit (INDEPENDENT_AMBULATORY_CARE_PROVIDER_SITE_OTHER): Payer: 59

## 2017-04-23 DIAGNOSIS — Z3042 Encounter for surveillance of injectable contraceptive: Secondary | ICD-10-CM

## 2017-04-23 MED ORDER — MEDROXYPROGESTERONE ACETATE 150 MG/ML IM SUSP
150.0000 mg | Freq: Once | INTRAMUSCULAR | Status: AC
  Administered 2017-04-23: 150 mg via INTRAMUSCULAR

## 2017-05-02 ENCOUNTER — Encounter: Payer: Self-pay | Admitting: Family Medicine

## 2017-05-02 ENCOUNTER — Ambulatory Visit (INDEPENDENT_AMBULATORY_CARE_PROVIDER_SITE_OTHER): Payer: 59 | Admitting: Family Medicine

## 2017-05-02 ENCOUNTER — Other Ambulatory Visit: Payer: Self-pay | Admitting: Family Medicine

## 2017-05-02 VITALS — BP 122/70 | HR 79 | Temp 98.6°F | Resp 15 | Ht 68.5 in | Wt 217.4 lb

## 2017-05-02 DIAGNOSIS — Z23 Encounter for immunization: Secondary | ICD-10-CM

## 2017-05-02 DIAGNOSIS — F17200 Nicotine dependence, unspecified, uncomplicated: Secondary | ICD-10-CM

## 2017-05-02 NOTE — Patient Instructions (Addendum)
Discussed trying Wellbutrin (bupropion) or over the counter nicotine products. Let me know if you want to try this medication. Take 800 units of Vitamin D daily.

## 2017-05-02 NOTE — Progress Notes (Signed)
Subjective:     Patient ID: Anita Lynch, female   DOB: 1980/05/20, 37 y.o.   MRN: [id number] Chief Complaint  Patient presents with  . Annual Exam    Patient comes in office for her annual physical she states that she feels well today and has no conerns or complaints to address. Pateint reports following a well balanced diet, she is not actively exercising and is sleeping on average 7hrs a night. Patient last reported pap smear was 08/10/16 HPV screen was negatie. Patient is due today to update her Tetnaus vaccine.    HPI She had physical and labs per GYN in 07/2016 so will focus on specific issues today. Will update tetanus today (Tdap in 1969) and discuss smoking cessation. Currently at 5 cigarettes a day. Works as a Science writer.  Review of Systems     Objective:   Physical Exam  Constitutional: She appears well-developed and well-nourished. No distress.  pleasant  Psychiatric: She has a normal mood and affect. Her behavior is normal.       Assessment:    1. Need for tetanus booster Td today 2. TOBACCO ABUSE     Plan:   Patient will consider bupropion and nicotine products. Call if she wishes to start rx. Encouraged regular exercise.

## 2017-07-05 ENCOUNTER — Other Ambulatory Visit: Payer: Self-pay | Admitting: Obstetrics & Gynecology

## 2017-07-05 DIAGNOSIS — G4489 Other headache syndrome: Secondary | ICD-10-CM

## 2017-07-09 ENCOUNTER — Other Ambulatory Visit: Payer: Self-pay | Admitting: Obstetrics & Gynecology

## 2017-07-09 ENCOUNTER — Telehealth: Payer: Self-pay

## 2017-07-09 DIAGNOSIS — G4489 Other headache syndrome: Secondary | ICD-10-CM

## 2017-07-09 MED ORDER — MEDROXYPROGESTERONE ACETATE 150 MG/ML IM SUSY: 1.0000 mL | PREFILLED_SYRINGE | INTRAMUSCULAR | 0 refills | Status: DC

## 2017-07-09 NOTE — Telephone Encounter (Signed)
Pt needs depo refill.  Pt has appt Mon.  Pharm said she needed appt.  [phone]  Pt aware Monday's appt is for inj only.  She isn't due for annual until after 08/10/17 which she will sched when she comes in on Monday.

## 2017-07-16 ENCOUNTER — Ambulatory Visit (INDEPENDENT_AMBULATORY_CARE_PROVIDER_SITE_OTHER): Payer: 59

## 2017-07-16 DIAGNOSIS — Z308 Encounter for other contraceptive management: Secondary | ICD-10-CM

## 2017-07-16 MED ORDER — MEDROXYPROGESTERONE ACETATE 150 MG/ML IM SUSP
150.0000 mg | Freq: Once | INTRAMUSCULAR | Status: AC
  Administered 2017-07-16: 150 mg via INTRAMUSCULAR

## 2017-07-16 NOTE — Progress Notes (Signed)
Pt here for depo which was given IM right glut.  NDC# [id number]

## 2017-08-20 ENCOUNTER — Ambulatory Visit (INDEPENDENT_AMBULATORY_CARE_PROVIDER_SITE_OTHER): Payer: 59 | Admitting: Obstetrics & Gynecology

## 2017-08-20 ENCOUNTER — Encounter: Payer: Self-pay | Admitting: Obstetrics & Gynecology

## 2017-08-20 VITALS — BP 120/70 | HR 76 | Ht 70.0 in | Wt 211.0 lb

## 2017-08-20 DIAGNOSIS — Z0000 Encounter for general adult medical examination without abnormal findings: Secondary | ICD-10-CM | POA: Diagnosis not present

## 2017-08-20 DIAGNOSIS — G4489 Other headache syndrome: Secondary | ICD-10-CM | POA: Insufficient documentation

## 2017-08-20 DIAGNOSIS — E559 Vitamin D deficiency, unspecified: Secondary | ICD-10-CM

## 2017-08-20 DIAGNOSIS — Z862 Personal history of diseases of the blood and blood-forming organs and certain disorders involving the immune mechanism: Secondary | ICD-10-CM | POA: Diagnosis not present

## 2017-08-20 NOTE — Patient Instructions (Signed)
PAP every three years Labs recheck

## 2017-08-20 NOTE — Progress Notes (Signed)
HPI:      [name] is a 37 y.o. [phone] who LMP was No LMP recorded. Patient has had an injection., she presents today for her annual examination. The patient has no complaints today. The patient is sexually active. Her last pap: approximate date 2018 and was normal. The patient does perform self breast exams.  There is no notable family history of breast or ovarian cancer in her family.  The patient has regular exercise: yes.  The patient denies current symptoms of depression.  Tried DEPO for cycle thus headache control last year, much improved.   Occas spotting  GYN History: Contraception: tubal ligation  PMHx: History reviewed. No pertinent past medical history. Past Surgical History:  Procedure Laterality Date  . CESAREAN SECTION  2002,2012   Family History  Problem Relation Age of Onset  . Hyperlipidemia Father   . Hypertension Father   . Lupus Sister   . Cancer Maternal Grandmother        breast  . Diabetes Paternal Grandmother   . Dementia Paternal Grandfather   . Alzheimer's disease Paternal Grandfather    Social History   Tobacco Use  . Smoking status: Current Every Day Smoker    Types: Cigarettes  . Smokeless tobacco: Never Used  Substance Use Topics  . Alcohol use: No    Alcohol/week: 0.0 oz  . Drug use: No    Current Outpatient Medications:  .  medroxyPROGESTERone Acetate 150 MG/ML SUSY, Inject 1 mL (150 mg total) into the muscle every 3 (three) months., Disp: 1 mL, Rfl: 0 Allergies: Bee venom and Shellfish allergy  Review of Systems  Constitutional: Negative for chills, fever and malaise/fatigue.  HENT: Negative for congestion, sinus pain and sore throat.   Eyes: Negative for blurred vision and pain.  Respiratory: Negative for cough and wheezing.   Cardiovascular: Negative for chest pain and leg swelling.  Gastrointestinal: Negative for abdominal pain, constipation, diarrhea, heartburn, nausea and vomiting.  Genitourinary: Negative for  dysuria, frequency, hematuria and urgency.  Musculoskeletal: Negative for back pain, joint pain, myalgias and neck pain.  Skin: Negative for itching and rash.  Neurological: Positive for headaches. Negative for dizziness, tremors and weakness.  Endo/Heme/Allergies: Does not bruise/bleed easily.  Psychiatric/Behavioral: Negative for depression. The patient is not nervous/anxious and does not have insomnia.     Objective: BP 120/70   Pulse 76   Ht 5\' 10"  (1.778 m)   Wt 211 lb (95.7 kg)   BMI 30.28 kg/m   Filed Weights   08/20/17 0838  Weight: 211 lb (95.7 kg)   Body mass index is 30.28 kg/m. Physical Exam  Constitutional: She is oriented to person, place, and time. She appears well-developed and well-nourished. No distress.  Genitourinary: Rectum normal, vagina normal and uterus normal. Pelvic exam was performed with patient supine. There is no rash or lesion on the right labia. There is no rash or lesion on the left labia. Vagina exhibits no lesion. No bleeding in the vagina. Right adnexum does not display mass and does not display tenderness. Left adnexum does not display mass and does not display tenderness. Cervix does not exhibit motion tenderness, lesion, friability or polyp.   Uterus is mobile and midaxial. Uterus is not enlarged or exhibiting a mass.  HENT:  Head: Normocephalic and atraumatic. Head is without laceration.  Right Ear: Hearing normal.  Left Ear: Hearing normal.  Nose: No epistaxis.  No foreign bodies.  Mouth/Throat: Uvula is midline, oropharynx is clear and moist  and mucous membranes are normal.  Eyes: Pupils are equal, round, and reactive to light.  Neck: Normal range of motion. Neck supple. No thyromegaly present.  Cardiovascular: Normal rate and regular rhythm. Exam reveals no gallop and no friction rub.  No murmur heard. Pulmonary/Chest: Effort normal and breath sounds normal. No respiratory distress. She has no wheezes. Right breast exhibits no mass, no  skin change and no tenderness. Left breast exhibits no mass, no skin change and no tenderness.  Abdominal: Soft. Bowel sounds are normal. She exhibits no distension. There is no tenderness. There is no rebound.  Musculoskeletal: Normal range of motion.  Neurological: She is alert and oriented to person, place, and time. No cranial nerve deficit.  Skin: Skin is warm and dry.  Psychiatric: She has a normal mood and affect. Judgment normal.  Vitals reviewed.   Assessment:  ANNUAL EXAM 1. Annual physical exam   2. Headache associated with hormonal factors   3. Vitamin D deficiency   4. History of anemia    Screening Plan:            1.  Cervical Screening-  Pap smear schedule reviewed with patient  2. Breast screening- Exam annually and mammogram>40 planned   3. Labs Last year normal except Vit D  4. Counseling for contraception: bilateral tubal ligation  Other:  1. Annual physical exam  2. Headache associated with hormonal factors Cont Depo.  Risks of osteoporosis discussed  3. Vitamin D deficiency Recheck level  4. History of anemia, also recheck levels    F/U  Return in about 1 year (around 08/21/2018) for Annual.  Barnett Applebaum, MD, [facility], [facility] 08/20/2017  9:00 AM

## 2017-08-21 LAB — CBC WITH DIFFERENTIAL
BASOS ABS: 0.1 10*3/uL (ref 0.0–0.2)
Basos: 1 %
EOS (ABSOLUTE): 0.1 10*3/uL (ref 0.0–0.4)
Eos: 2 %
HEMOGLOBIN: 13.7 g/dL (ref 11.1–15.9)
Hematocrit: 41.9 % (ref 34.0–46.6)
IMMATURE GRANULOCYTES: 0 %
Immature Grans (Abs): 0 10*3/uL (ref 0.0–0.1)
Lymphocytes Absolute: 2.5 10*3/uL (ref 0.7–3.1)
Lymphs: 50 %
MCH: 29 pg (ref 26.6–33.0)
MCHC: 32.7 g/dL (ref 31.5–35.7)
MCV: 89 fL (ref 79–97)
MONOCYTES: 12 %
MONOS ABS: 0.6 10*3/uL (ref 0.1–0.9)
Neutrophils Absolute: 1.7 10*3/uL (ref 1.4–7.0)
Neutrophils: 35 %
RBC: 4.72 x10E6/uL (ref 3.77–5.28)
RDW: 13.6 % (ref 12.3–15.4)
WBC: 5 10*3/uL (ref 3.4–10.8)

## 2017-08-21 LAB — VITAMIN D 25 HYDROXY (VIT D DEFICIENCY, FRACTURES): VIT D 25 HYDROXY: 19.8 ng/mL — AB (ref 30.0–100.0)

## 2017-10-08 ENCOUNTER — Ambulatory Visit (INDEPENDENT_AMBULATORY_CARE_PROVIDER_SITE_OTHER): Payer: 59

## 2017-10-08 DIAGNOSIS — Z3042 Encounter for surveillance of injectable contraceptive: Secondary | ICD-10-CM

## 2017-10-08 MED ORDER — MEDROXYPROGESTERONE ACETATE 150 MG/ML IM SUSP
150.0000 mg | Freq: Once | INTRAMUSCULAR | Status: AC
  Administered 2017-10-08: 150 mg via INTRAMUSCULAR

## 2017-12-28 ENCOUNTER — Other Ambulatory Visit: Payer: Self-pay | Admitting: Obstetrics & Gynecology

## 2017-12-28 DIAGNOSIS — G4489 Other headache syndrome: Secondary | ICD-10-CM

## 2017-12-28 MED ORDER — MEDROXYPROGESTERONE ACETATE 150 MG/ML IM SUSY: 1.0000 mL | PREFILLED_SYRINGE | INTRAMUSCULAR | 0 refills | Status: DC

## 2017-12-31 ENCOUNTER — Ambulatory Visit (INDEPENDENT_AMBULATORY_CARE_PROVIDER_SITE_OTHER): Payer: 59

## 2017-12-31 DIAGNOSIS — Z3042 Encounter for surveillance of injectable contraceptive: Secondary | ICD-10-CM | POA: Diagnosis not present

## 2017-12-31 MED ORDER — MEDROXYPROGESTERONE ACETATE 150 MG/ML IM SUSP
150.0000 mg | Freq: Once | INTRAMUSCULAR | Status: AC
  Administered 2017-12-31: 150 mg via INTRAMUSCULAR

## 2018-03-20 ENCOUNTER — Other Ambulatory Visit: Payer: Self-pay | Admitting: Obstetrics & Gynecology

## 2018-03-20 DIAGNOSIS — G4489 Other headache syndrome: Secondary | ICD-10-CM

## 2018-03-20 MED ORDER — MEDROXYPROGESTERONE ACETATE 150 MG/ML IM SUSY: 150.0000 mg | PREFILLED_SYRINGE | INTRAMUSCULAR | 1 refills | Status: DC

## 2018-03-20 MED ORDER — MEDROXYPROGESTERONE ACETATE 150 MG/ML IM SUSY
1.0000 mL | PREFILLED_SYRINGE | INTRAMUSCULAR | 0 refills | Status: DC
Start: 2018-03-20 — End: 2018-03-28

## 2018-03-25 ENCOUNTER — Ambulatory Visit (INDEPENDENT_AMBULATORY_CARE_PROVIDER_SITE_OTHER): Payer: 59

## 2018-03-25 DIAGNOSIS — Z3042 Encounter for surveillance of injectable contraceptive: Secondary | ICD-10-CM | POA: Diagnosis not present

## 2018-03-25 MED ORDER — MEDROXYPROGESTERONE ACETATE 150 MG/ML IM SUSP
150.0000 mg | Freq: Once | INTRAMUSCULAR | Status: AC
  Administered 2018-03-25: 150 mg via INTRAMUSCULAR

## 2018-03-28 ENCOUNTER — Ambulatory Visit (INDEPENDENT_AMBULATORY_CARE_PROVIDER_SITE_OTHER): Payer: Self-pay | Admitting: Physician Assistant

## 2018-03-28 ENCOUNTER — Encounter: Payer: Self-pay | Admitting: Physician Assistant

## 2018-03-28 VITALS — BP 132/88 | HR 84 | Temp 98.5°F | Wt 207.0 lb

## 2018-03-28 DIAGNOSIS — R490 Dysphonia: Secondary | ICD-10-CM

## 2018-03-28 DIAGNOSIS — J040 Acute laryngitis: Secondary | ICD-10-CM

## 2018-03-28 LAB — POCT RAPID STREP A (OFFICE): Rapid Strep A Screen: NEGATIVE

## 2018-03-28 MED ORDER — PREDNISONE 50 MG PO TABS: 50.0000 mg | ORAL_TABLET | Freq: Every day | ORAL | 0 refills | Status: AC

## 2018-03-28 MED ORDER — AZELASTINE HCL 0.1 % NA SOLN: 1.0000 | Freq: Two times a day (BID) | NASAL | 0 refills | Status: DC

## 2018-03-28 NOTE — Progress Notes (Addendum)
Patient ID: Anita Lynch DOB: May 04, 1980 AGE: 37 y.o. MRN: [id number]   PCP: Carmon Ginsberg, PA   Chief Complaint:  Chief Complaint  Patient presents with  . choice-hoarseness    1d     Subjective:    HPI:  Anita Lynch is a 37 y.o. female presents for evaluation  Chief Complaint  Patient presents with  . choice-hoarseness    97d    37 year old female presents to [facility] with two day history of hoarse voice. Rough sounding. Waxes and wanes throughout the day. Woke up yesterday morning with symptoms. Associated very mild headache, nasal congestion, postnasal drip, and tender/enlarged left sided cervical lymphnode. Yesterday patient looked in throat, visualized two white spots. Removed with finger. Consistency of cotton from a cotton ball. Patient also noticed bump/mass on left tonsil; confident is new. Patient has not taken any OTC medication for symptom relief. Patient denies fever, chills, bodyaches, ear pain, sinus pain, sore throat, pain with swallowing, cough, chest pain, SOB, wheezing.   Patient denies recent course of antibiotics, oral steroids, steroid inhaler. Not immunocompromised.  Patient works in home health care. Concerned about her contagiousness.  A complete, at least 10 system review of symptoms was performed, pertinent positives and negatives as mentioned in HPI.  The following portions of the patient's history were reviewed and updated as appropriate: allergies, current medications and past medical history.  Patient Active Problem List   Diagnosis Date Noted  . Headache associated with hormonal factors 08/20/2017  . Vitamin D deficiency 08/20/2017  . TOBACCO ABUSE 10/04/2009    Allergies  Allergen Reactions  . Bee Venom Swelling    Bees  . Shellfish Allergy Swelling    Current Outpatient Medications on File Prior to Visit  Medication Sig Dispense Refill  . medroxyPROGESTERone Acetate 150 MG/ML SUSY Inject 1 mL (150 mg  total) into the muscle every 3 (three) months. 1 mL 1   No current facility-administered medications on file prior to visit.        Objective:   Vitals:   03/28/18 1024  BP: 132/88  Pulse: 84  Temp: 98.5 F (36.9 C)  SpO2: 100%     Wt Readings from Last 3 Encounters:  03/28/18 207 lb (93.9 kg)  08/20/17 211 lb (95.7 kg)  05/02/17 217 lb 6.4 oz (98.6 kg)    Physical Exam:   General Appearance:  Smiling, friendly, cooperative, appears stated age. In no acute distress. Afebrile.  Head:  Normocephalic, without obvious abnormality, atraumatic  Eyes:  PERRL, conjunctiva/corneas clear, EOM's intact  Ears:  Normal TM's and external ear canals, both ears  Nose: Nares normal, septum midline. No discharge. Normal mucosa. No sinus tenderness with percussion/palpation.  Throat: Lips, mucosa, and tongue normal; teeth and gums normal. Throat reveals no significant erythema. Tonsils not swollen. Very small crevice on left tonsil, with appearance of tonsillolith. No exudate. No bleeding. 1cm circular tonsillar nodule on left tonsil (patient unsure if new or not). Pale in color with overlying vascularity.   Neck: Supple, symmetrical, trachea midline, mild left anterior cervical lymphadenopathy, tender to palpation.  Lungs:   Clear to auscultation bilaterally, respirations unlabored  Heart:  Regular rate and rhythm, S1 and S2 normal, no murmur, rub, or gallop  Extremities: Extremities normal, atraumatic, no cyanosis or edema  Pulses: 2+ and symmetric  Skin: Skin color, texture, turgor normal, no rashes or lesions  Lymph nodes: Cervical, supraclavicular, and axillary nodes normal  Neurologic: Normal    Assessment &  Plan:    Exam findings, diagnosis etiology and medication use and indications reviewed with patient. Follow-Up and discharge instructions provided. No emergent/urgent issues found on exam.  Patient education was provided.   Patient verbalized understanding of information  provided and agrees with plan of care (POC), all questions answered. The patient is advised to call or return to clinic if condition does not see an improvement in symptoms, or to seek the care of the closest emergency department if condition worsens with the below plan.    1. Laryngitis  - azelastine (ASTELIN) 0.1 % nasal spray; Place 1 spray into both nostrils 2 (two) times daily. Use in each nostril as directed  Dispense: 30 mL; Refill: 0 - predniSONE (DELTASONE) 50 MG tablet; Take 1 tablet (50 mg total) by mouth daily with breakfast for 3 days.  Dispense: 3 tablet; Refill: 0  2. Hoarseness  - POCT rapid strep A. NEGATIVE.  Patient with 2 day history of hoarse voice. Diagnosed with laryngitis. Suspect viral URI/PND. Negative rapid strep test. Story and appearance not consistent with exudative tonsillitis or oral candidiasis. Prescribed Astelin nasal spray and 3-day course of Prednisone 50mg . Advised patient return to clinic in a few days if symptoms not improving.  Advised patient monitor left tonsillar lesion/mass. Appearance of benign tonsillar cyst. If does not resolve within 1-2 weeks, should have evaluated by PCP, dentist, or ENT. No history of smokeless tobacco use; however, patient is a cigarette smoker. Patient agrees with plan.   Darlin Priestly, MHS, PA-C Montey Hora, MHS, PA-C Advanced Practice Provider [facility]  [address], [facility], [place], [place] [address] (p):  [phone] [email or web address]

## 2018-03-28 NOTE — Patient Instructions (Signed)
Thank you for choosing [facility] for your health care needs.  You have been diagnosed with laryngitis (inflammation of vocal cords / hoarse voice).  Your rapid strep test performed in the office today was NEGATIVE.  Recommend gargle with salt water. Use analgesic throat lozenges or sprays such as Cepacol or Chloraseptic.  You have been prescribed an antihistamine/allergy nasal spray, Astelin. You have also been prescribed a 3-day course of prednisone, a steroid, will decrease swelling of vocal cords, should help with voice.  Return to [facility] or follow-up with family physician if symptoms not improving in a few days.  Recommend you discuss tonsillar lesion with dentist or oral surgeon or ENT for further evaluation/treatment.  Laryngitis Laryngitis is swelling (inflammation) of your vocal cords. This causes hoarseness, coughing, loss of voice, sore throat, or a dry throat. When your vocal cords are inflamed, your voice sounds different. Laryngitis can be temporary (acute) or long-term (chronic). Most cases of acute laryngitis improve with time. Chronic laryngitis is laryngitis that lasts for more than three weeks. Follow these instructions at home:  Drink enough fluid to keep your pee (urine) clear or pale yellow.  Breathe in moist air. Use a humidifier if you live in a dry climate.  Take medicines only as told by your doctor.  Do not smoke cigarettes or electronic cigarettes. If you need help quitting, ask your doctor.  Talk as little as possible. Also avoid whispering, which can cause vocal strain.  Write instead of talking. Do this until your voice is back to normal. Contact a doctor if:  You have a fever.  Your pain is worse.  You have trouble swallowing. Get help right away if:  You cough up blood.  You have trouble breathing. This information is not intended to replace advice given to you by your health care provider. Make sure you discuss any questions you have with  your health care provider. Document Released: 03/30/2011 Document Revised: 09/16/2015 Document Reviewed: 09/23/2013 Elsevier Interactive Patient Education  [employer].

## 2018-04-01 ENCOUNTER — Telehealth: Payer: Self-pay | Admitting: Emergency Medicine

## 2018-04-01 NOTE — Telephone Encounter (Signed)
Left message follow up call from visit with [facility]. 

## 2018-05-10 ENCOUNTER — Other Ambulatory Visit: Payer: Self-pay | Admitting: Family Medicine

## 2018-05-10 ENCOUNTER — Encounter: Payer: Self-pay | Admitting: Family Medicine

## 2018-05-10 MED ORDER — BUPROPION HCL ER (SR) 150 MG PO TB12: ORAL_TABLET | ORAL | 1 refills | Status: DC

## 2018-06-17 ENCOUNTER — Ambulatory Visit (INDEPENDENT_AMBULATORY_CARE_PROVIDER_SITE_OTHER): Payer: 59

## 2018-06-17 DIAGNOSIS — Z3042 Encounter for surveillance of injectable contraceptive: Secondary | ICD-10-CM | POA: Diagnosis not present

## 2018-06-17 MED ORDER — MEDROXYPROGESTERONE ACETATE 150 MG/ML IM SUSP
150.0000 mg | Freq: Once | INTRAMUSCULAR | Status: AC
  Administered 2018-06-17: 150 mg via INTRAMUSCULAR

## 2018-06-29 DIAGNOSIS — N898 Other specified noninflammatory disorders of vagina: Secondary | ICD-10-CM | POA: Diagnosis not present

## 2018-06-29 DIAGNOSIS — N760 Acute vaginitis: Secondary | ICD-10-CM | POA: Diagnosis not present

## 2018-06-29 DIAGNOSIS — R350 Frequency of micturition: Secondary | ICD-10-CM | POA: Diagnosis not present

## 2018-06-29 DIAGNOSIS — R8290 Unspecified abnormal findings in urine: Secondary | ICD-10-CM | POA: Diagnosis not present

## 2018-09-02 ENCOUNTER — Ambulatory Visit (INDEPENDENT_AMBULATORY_CARE_PROVIDER_SITE_OTHER): Payer: 59

## 2018-09-02 ENCOUNTER — Other Ambulatory Visit: Payer: Self-pay

## 2018-09-02 DIAGNOSIS — Z3042 Encounter for surveillance of injectable contraceptive: Secondary | ICD-10-CM | POA: Diagnosis not present

## 2018-09-02 MED ORDER — MEDROXYPROGESTERONE ACETATE 150 MG/ML IM SUSP
150.0000 mg | Freq: Once | INTRAMUSCULAR | Status: AC
  Administered 2018-09-02: 150 mg via INTRAMUSCULAR

## 2018-10-21 ENCOUNTER — Ambulatory Visit (INDEPENDENT_AMBULATORY_CARE_PROVIDER_SITE_OTHER): Payer: 59 | Admitting: Obstetrics & Gynecology

## 2018-10-21 ENCOUNTER — Other Ambulatory Visit: Payer: Self-pay

## 2018-10-21 ENCOUNTER — Encounter: Payer: Self-pay | Admitting: Obstetrics & Gynecology

## 2018-10-21 VITALS — BP 130/64 | HR 89 | Ht 71.0 in | Wt 216.0 lb

## 2018-10-21 DIAGNOSIS — Z01419 Encounter for gynecological examination (general) (routine) without abnormal findings: Secondary | ICD-10-CM

## 2018-10-21 DIAGNOSIS — E559 Vitamin D deficiency, unspecified: Secondary | ICD-10-CM | POA: Diagnosis not present

## 2018-10-21 DIAGNOSIS — G4489 Other headache syndrome: Secondary | ICD-10-CM

## 2018-10-21 MED ORDER — MEDROXYPROGESTERONE ACETATE 150 MG/ML IM SUSY: 150.0000 mg | PREFILLED_SYRINGE | INTRAMUSCULAR | 1 refills | Status: DC

## 2018-10-21 NOTE — Patient Instructions (Addendum)
PAP every three years Labs today; full panel next year

## 2018-10-21 NOTE — Progress Notes (Signed)
HPI:      [name] is a 38 y.o. [phone] who LMP was No LMP recorded. Patient has had an injection., she presents today for her annual examination. The patient has no complaints today. The patient is sexually active. Her last pap: approximate date 2018 and was normal. The patient does perform self breast exams.  There is notable family history of breast or ovarian cancer in her family.  The patient has regular exercise: yes.  The patient denies current symptoms of depression.    GYN History: Contraception: tubal ligation  PMHx: History reviewed. No pertinent past medical history. Past Surgical History:  Procedure Laterality Date  . CESAREAN SECTION  2002,2012   Family History  Problem Relation Age of Onset  . Hyperlipidemia Father   . Hypertension Father   . Lupus Sister   . Cancer Maternal Grandmother        breast  . Diabetes Paternal Grandmother   . Dementia Paternal Grandfather   . Alzheimer's disease Paternal Grandfather    Social History   Tobacco Use  . Smoking status: Current Every Day Smoker    Types: Cigarettes  . Smokeless tobacco: Never Used  Substance Use Topics  . Alcohol use: No    Alcohol/week: 0.0 standard drinks  . Drug use: No    Current Outpatient Medications:  .  buPROPion (WELLBUTRIN SR) 150 MG 12 hr tablet, Start at one pill daily for a week then twice daily., Disp: 60 tablet, Rfl: 1 .  medroxyPROGESTERone Acetate 150 MG/ML SUSY, Inject 1 mL (150 mg total) into the muscle every 3 (three) months., Disp: 1 mL, Rfl: 1 Allergies: Bee venom and Shellfish allergy  Review of Systems  Constitutional: Negative for chills, fever and malaise/fatigue.  HENT: Negative for congestion, sinus pain and sore throat.   Eyes: Negative for blurred vision and pain.  Respiratory: Negative for cough and wheezing.   Cardiovascular: Negative for chest pain and leg swelling.  Gastrointestinal: Negative for abdominal pain, constipation, diarrhea, heartburn,  nausea and vomiting.  Genitourinary: Negative for dysuria, frequency, hematuria and urgency.  Musculoskeletal: Negative for back pain, joint pain, myalgias and neck pain.  Skin: Negative for itching and rash.  Neurological: Negative for dizziness, tremors and weakness.  Endo/Heme/Allergies: Does not bruise/bleed easily.  Psychiatric/Behavioral: Negative for depression. The patient is not nervous/anxious and does not have insomnia.     Objective: BP 130/64   Pulse 89   Ht 5\' 11"  (1.803 m)   Wt 216 lb (98 kg)   BMI 30.13 kg/m   Filed Weights   10/21/18 0941  Weight: 216 lb (98 kg)   Body mass index is 30.13 kg/m. Physical Exam Constitutional:      General: She is not in acute distress.    Appearance: She is well-developed.  Genitourinary:     Pelvic exam was performed with patient supine.     Vagina, uterus and rectum normal.     No lesions in the vagina.     No vaginal bleeding.     No cervical motion tenderness, friability, lesion or polyp.     Uterus is mobile.     Uterus is not enlarged.     No uterine mass detected.    Uterus is midaxial.     No right or left adnexal mass present.     Right adnexa not tender.     Left adnexa not tender.  HENT:     Head: Normocephalic and atraumatic. No laceration.  Right Ear: Hearing normal.     Left Ear: Hearing normal.     Mouth/Throat:     Pharynx: Uvula midline.  Eyes:     Pupils: Pupils are equal, round, and reactive to light.  Neck:     Musculoskeletal: Normal range of motion and neck supple.     Thyroid: No thyromegaly.  Cardiovascular:     Rate and Rhythm: Normal rate and regular rhythm.     Heart sounds: No murmur. No friction rub. No gallop.   Pulmonary:     Effort: Pulmonary effort is normal. No respiratory distress.     Breath sounds: Normal breath sounds. No wheezing.  Chest:     Breasts:        Right: No mass, skin change or tenderness.        Left: No mass, skin change or tenderness.  Abdominal:      General: Bowel sounds are normal. There is no distension.     Palpations: Abdomen is soft.     Tenderness: There is no abdominal tenderness. There is no rebound.  Musculoskeletal: Normal range of motion.  Neurological:     Mental Status: She is alert and oriented to person, place, and time.     Cranial Nerves: No cranial nerve deficit.  Skin:    General: Skin is warm and dry.  Psychiatric:        Judgment: Judgment normal.  Vitals signs reviewed.     Assessment:  ANNUAL EXAM 1. Women's annual routine gynecological examination   2. Vitamin D deficiency   3. Headache associated with hormonal factors     Screening Plan:            1.  Cervical Screening-  Pap smear schedule reviewed with patient  2. Breast screening- Exam annually and mammogram>40 planned   3. Colonoscopy every 10 years, Hemoccult testing - after age 41  4. Labs Ordered today for Vit D only.  Full panel next year  5. Counseling for contraception: bilateral tubal ligation   6. Vitamin D deficiency - OTC 1000 U daily  - VITAMIN D 25 Hydroxy (Vit-D Deficiency, Fractures) lab check  7. Headache associated with hormonal factors - Cont Depo for period and menstrual migraine control - medroxyPROGESTERone Acetate 150 MG/ML SUSY; Inject 1 mL (150 mg total) into the muscle every 3 (three) months.  Dispense: 1 mL; Refill: 1     F/U  Return in about 1 year (around 10/21/2019) for Annual.  Barnett Applebaum, MD, [facility], [facility] 10/21/2018  9:57 AM

## 2018-10-22 LAB — VITAMIN D 25 HYDROXY (VIT D DEFICIENCY, FRACTURES): Vit D, 25-Hydroxy: 19.8 ng/mL — ABNORMAL LOW (ref 30.0–100.0)

## 2018-11-25 ENCOUNTER — Other Ambulatory Visit: Payer: Self-pay

## 2018-11-25 ENCOUNTER — Ambulatory Visit (INDEPENDENT_AMBULATORY_CARE_PROVIDER_SITE_OTHER): Payer: 59

## 2018-11-25 DIAGNOSIS — Z3042 Encounter for surveillance of injectable contraceptive: Secondary | ICD-10-CM | POA: Diagnosis not present

## 2018-11-25 MED ORDER — MEDROXYPROGESTERONE ACETATE 150 MG/ML IM SUSP
150.0000 mg | Freq: Once | INTRAMUSCULAR | Status: AC
  Administered 2018-11-25: 150 mg via INTRAMUSCULAR

## 2018-11-25 NOTE — Patient Instructions (Signed)
Pt here fro Depo Provera injection. Given ROUQ, tolerated well

## 2018-12-12 ENCOUNTER — Other Ambulatory Visit: Payer: Self-pay | Admitting: Obstetrics & Gynecology

## 2018-12-12 ENCOUNTER — Encounter: Payer: Self-pay | Admitting: Obstetrics & Gynecology

## 2018-12-12 MED ORDER — METRONIDAZOLE 0.75 % VA GEL: 1.0000 | Freq: Every day | VAGINAL | 0 refills | Status: AC

## 2018-12-12 NOTE — Telephone Encounter (Signed)
Please advise 

## 2019-02-17 ENCOUNTER — Ambulatory Visit (INDEPENDENT_AMBULATORY_CARE_PROVIDER_SITE_OTHER): Payer: 59

## 2019-02-17 ENCOUNTER — Other Ambulatory Visit: Payer: Self-pay

## 2019-02-17 DIAGNOSIS — Z3042 Encounter for surveillance of injectable contraceptive: Secondary | ICD-10-CM | POA: Diagnosis not present

## 2019-02-17 MED ORDER — MEDROXYPROGESTERONE ACETATE 150 MG/ML IM SUSP
150.0000 mg | Freq: Once | INTRAMUSCULAR | Status: AC
  Administered 2019-02-17: 150 mg via INTRAMUSCULAR

## 2019-02-17 NOTE — Progress Notes (Signed)
Patient presents today for Depo Provera injection within dates. Given IM LUOQ. Patient tolerated well. 

## 2019-05-06 ENCOUNTER — Other Ambulatory Visit: Payer: Self-pay | Admitting: Obstetrics & Gynecology

## 2019-05-06 DIAGNOSIS — G4489 Other headache syndrome: Secondary | ICD-10-CM

## 2019-05-12 ENCOUNTER — Ambulatory Visit (INDEPENDENT_AMBULATORY_CARE_PROVIDER_SITE_OTHER): Payer: 59

## 2019-05-12 ENCOUNTER — Other Ambulatory Visit: Payer: Self-pay

## 2019-05-12 DIAGNOSIS — Z3042 Encounter for surveillance of injectable contraceptive: Secondary | ICD-10-CM | POA: Diagnosis not present

## 2019-05-12 MED ORDER — MEDROXYPROGESTERONE ACETATE 150 MG/ML IM SUSP
150.0000 mg | Freq: Once | INTRAMUSCULAR | Status: AC
  Administered 2019-05-12: 150 mg via INTRAMUSCULAR

## 2019-05-12 NOTE — Progress Notes (Signed)
Pt here for depo which was given IM right glut.  NDC# 0548-5701-00 

## 2019-08-04 ENCOUNTER — Ambulatory Visit (INDEPENDENT_AMBULATORY_CARE_PROVIDER_SITE_OTHER): Payer: 59

## 2019-08-04 ENCOUNTER — Other Ambulatory Visit: Payer: Self-pay

## 2019-08-04 DIAGNOSIS — Z3042 Encounter for surveillance of injectable contraceptive: Secondary | ICD-10-CM

## 2019-08-04 MED ORDER — MEDROXYPROGESTERONE ACETATE 150 MG/ML IM SUSP
150.0000 mg | Freq: Once | INTRAMUSCULAR | Status: AC
  Administered 2019-08-04: 150 mg via INTRAMUSCULAR

## 2019-08-04 NOTE — Progress Notes (Signed)
Patient presents today for Depo Provera injection within dates. Given IM LUOQ. Patient tolerated well. 

## 2019-10-24 ENCOUNTER — Other Ambulatory Visit: Payer: Self-pay | Admitting: Obstetrics & Gynecology

## 2019-10-24 DIAGNOSIS — G4489 Other headache syndrome: Secondary | ICD-10-CM

## 2019-10-28 ENCOUNTER — Ambulatory Visit: Payer: 59

## 2019-10-28 ENCOUNTER — Other Ambulatory Visit: Payer: Self-pay | Admitting: Obstetrics & Gynecology

## 2019-10-28 ENCOUNTER — Ambulatory Visit (INDEPENDENT_AMBULATORY_CARE_PROVIDER_SITE_OTHER): Payer: 59 | Admitting: Obstetrics & Gynecology

## 2019-10-28 ENCOUNTER — Other Ambulatory Visit (HOSPITAL_COMMUNITY)
Admission: RE | Admit: 2019-10-28 | Discharge: 2019-10-28 | Disposition: A | Discharge status: Home / Self Care | Payer: 59 | Source: Ambulatory Visit | Attending: Obstetrics & Gynecology | Admitting: Obstetrics & Gynecology

## 2019-10-28 ENCOUNTER — Encounter: Payer: Self-pay | Admitting: Obstetrics & Gynecology

## 2019-10-28 ENCOUNTER — Other Ambulatory Visit: Payer: Self-pay

## 2019-10-28 VITALS — BP 120/80 | Ht 70.0 in | Wt 214.0 lb

## 2019-10-28 DIAGNOSIS — G4489 Other headache syndrome: Secondary | ICD-10-CM | POA: Diagnosis not present

## 2019-10-28 DIAGNOSIS — Z124 Encounter for screening for malignant neoplasm of cervix: Secondary | ICD-10-CM | POA: Diagnosis not present

## 2019-10-28 DIAGNOSIS — Z131 Encounter for screening for diabetes mellitus: Secondary | ICD-10-CM

## 2019-10-28 DIAGNOSIS — Z1329 Encounter for screening for other suspected endocrine disorder: Secondary | ICD-10-CM | POA: Diagnosis not present

## 2019-10-28 DIAGNOSIS — E559 Vitamin D deficiency, unspecified: Secondary | ICD-10-CM | POA: Diagnosis not present

## 2019-10-28 DIAGNOSIS — Z01419 Encounter for gynecological examination (general) (routine) without abnormal findings: Secondary | ICD-10-CM

## 2019-10-28 DIAGNOSIS — Z13220 Encounter for screening for lipoid disorders: Secondary | ICD-10-CM | POA: Diagnosis not present

## 2019-10-28 DIAGNOSIS — R102 Pelvic and perineal pain: Secondary | ICD-10-CM

## 2019-10-28 DIAGNOSIS — Z3042 Encounter for surveillance of injectable contraceptive: Secondary | ICD-10-CM | POA: Diagnosis not present

## 2019-10-28 MED ORDER — MEDROXYPROGESTERONE ACETATE 150 MG/ML IM SUSY: 150.0000 mg | PREFILLED_SYRINGE | INTRAMUSCULAR | 3 refills | Status: DC

## 2019-10-28 MED ORDER — MEDROXYPROGESTERONE ACETATE 150 MG/ML IM SUSP
150.0000 mg | Freq: Once | INTRAMUSCULAR | Status: AC
  Administered 2019-10-28: 150 mg via INTRAMUSCULAR

## 2019-10-28 NOTE — Patient Instructions (Addendum)
PAP every three years Labs soon Ultrasound due to pelvic pain  Thank you for choosing [facility]. As part of our ongoing efforts to improve patient experience, we would appreciate your feedback. Please fill out the short survey that you will receive by mail or MyChart. Your opinion is important to [place]!

## 2019-10-28 NOTE — Progress Notes (Signed)
HPI:      [name] is a 39 y.o. G2P0202 who LMP was Patient's last menstrual period was 10/11/2019., she presents today for her annual examination. The patient has no complaints today. The patient is sexually active. Her last pap: approximate date 2018 and was normal and last mammogram: patient has never had a mammogram. The patient does perform self breast exams.  There is no notable family history of breast or ovarian cancer in her family.  The patient has regular exercise: yes.  The patient denies current symptoms of depression.    GYN History: Contraception: tubal ligation  Takes Depo to suppress periods mostly to suppress premenstrual headaches, for which it is successful Since using Depo she has noticed lower abd pains almost daily, no pattern, mild, no radiation, no assoc sx's, takes Tylenol to help at times.  PMHx: No past medical history on file. Past Surgical History:  Procedure Laterality Date   CESAREAN SECTION  2002,2012   Family History  Problem Relation Age of Onset   Hyperlipidemia Father    Hypertension Father    Lupus Sister    Cancer Maternal Grandmother        breast   Diabetes Paternal Grandmother    Dementia Paternal Grandfather    Alzheimer's disease Paternal Grandfather    Social History   Tobacco Use   Smoking status: Current Every Day Smoker    Types: Cigarettes   Smokeless tobacco: Never Used  Scientific laboratory technician Use: Never used  Substance Use Topics   Alcohol use: No    Alcohol/week: 0.0 standard drinks   Drug use: No    Current Outpatient Medications:    medroxyPROGESTERone Acetate 150 MG/ML SUSY, INJECT 1 ML (150 MG TOTAL) INTO THE MUSCLE EVERY 3 (THREE) MONTHS., Disp: 1 mL, Rfl: 1   buPROPion (WELLBUTRIN SR) 150 MG 12 hr tablet, Start at one pill daily for a week then twice daily. (Patient not taking: Reported on 10/28/2019), Disp: 60 tablet, Rfl: 1 Allergies: Bee venom and Shellfish allergy  Review of Systems    Constitutional: Negative for chills, fever and malaise/fatigue.  HENT: Negative for congestion, sinus pain and sore throat.   Eyes: Negative for blurred vision and pain.  Respiratory: Negative for cough and wheezing.   Cardiovascular: Negative for chest pain and leg swelling.  Gastrointestinal: Positive for abdominal pain. Negative for constipation, diarrhea, heartburn, nausea and vomiting.  Genitourinary: Negative for dysuria, frequency, hematuria and urgency.  Musculoskeletal: Negative for back pain, joint pain, myalgias and neck pain.  Skin: Negative for itching and rash.  Neurological: Negative for dizziness, tremors and weakness.  Endo/Heme/Allergies: Does not bruise/bleed easily.  Psychiatric/Behavioral: Negative for depression. The patient is not nervous/anxious and does not have insomnia.     Objective: BP 120/80    Ht 5\' 10"  (1.778 m)    Wt 214 lb (97.1 kg)    LMP 10/11/2019    BMI 30.71 kg/m   Filed Weights   10/28/19 1403  Weight: 214 lb (97.1 kg)   Body mass index is 30.71 kg/m. Physical Exam Constitutional:      General: She is not in acute distress.    Appearance: She is well-developed.  Genitourinary:     Pelvic exam was performed with patient supine.     Vagina, uterus and rectum normal.     No lesions in the vagina.     No vaginal bleeding.     No cervical motion tenderness, friability, lesion or  polyp.     Uterus is mobile.     Uterus is not enlarged.     No uterine mass detected.    Uterus is midaxial.     No right or left adnexal mass present.     Right adnexa not tender.     Left adnexa not tender.  HENT:     Head: Normocephalic and atraumatic. No laceration.     Right Ear: Hearing normal.     Left Ear: Hearing normal.     Mouth/Throat:     Pharynx: Uvula midline.  Eyes:     Pupils: Pupils are equal, round, and reactive to light.  Neck:     Thyroid: No thyromegaly.  Cardiovascular:     Rate and Rhythm: Normal rate and regular rhythm.     Heart  sounds: No murmur heard.  No friction rub. No gallop.   Pulmonary:     Effort: Pulmonary effort is normal. No respiratory distress.     Breath sounds: Normal breath sounds. No wheezing.  Chest:     Breasts:        Right: No mass, skin change or tenderness.        Left: No mass, skin change or tenderness.  Abdominal:     General: Bowel sounds are normal. There is no distension.     Palpations: Abdomen is soft.     Tenderness: There is no abdominal tenderness. There is no rebound.  Musculoskeletal:        General: Normal range of motion.     Cervical back: Normal range of motion and neck supple.  Neurological:     Mental Status: She is alert and oriented to person, place, and time.     Cranial Nerves: No cranial nerve deficit.  Skin:    General: Skin is warm and dry.  Psychiatric:        Judgment: Judgment normal.  Vitals reviewed.     Assessment:  ANNUAL EXAM 1. Women's annual routine gynecological examination   2. Encounter for Depo-Provera contraception   3. Encounter for surveillance of injectable contraceptive   4. Headache associated with hormonal factors     Screening Plan:            1.  Cervical Screening-  Pap smear done today  2. Breast screening- Exam annually and mammogram>40 planned   3. Lower pelvic pains, mild Plan [place] to assess Prefers no surgery, so will monitor unless cysts or fibroids identified  4. Labs To return fasting at a later date  5. Counseling for contraception: bilateral tubal ligation Encounter for surveillance of injectable contraceptive due to premenstrual headaches for which it helps a lot Headache associated with hormonal factors - Cont Depo Provera q 12 weeks        F/U  Return in about 12 weeks (around 01/20/2020).  Barnett Applebaum, MD, [facility], [facility] 10/28/2019  2:33 PM

## 2019-10-28 NOTE — Progress Notes (Signed)
Patient presents today for Depo Provera injection within dates. Given IM LUOQ. Patient tolerated well. 

## 2019-10-30 LAB — CYTOLOGY - PAP
Comment: NEGATIVE
Diagnosis: NEGATIVE
High risk HPV: NEGATIVE

## 2019-11-12 ENCOUNTER — Encounter: Payer: Self-pay | Admitting: Obstetrics and Gynecology

## 2019-11-18 ENCOUNTER — Other Ambulatory Visit: Payer: Self-pay

## 2019-11-18 ENCOUNTER — Encounter: Payer: Self-pay | Admitting: Obstetrics & Gynecology

## 2019-11-18 ENCOUNTER — Ambulatory Visit (INDEPENDENT_AMBULATORY_CARE_PROVIDER_SITE_OTHER): Payer: 59 | Admitting: Obstetrics & Gynecology

## 2019-11-18 ENCOUNTER — Ambulatory Visit (INDEPENDENT_AMBULATORY_CARE_PROVIDER_SITE_OTHER): Payer: 59

## 2019-11-18 VITALS — BP 120/80 | Ht 69.0 in | Wt 212.0 lb

## 2019-11-18 DIAGNOSIS — R102 Pelvic and perineal pain: Secondary | ICD-10-CM

## 2019-11-18 NOTE — Progress Notes (Signed)
  HPI: Pt was having some lower quadrant pains at times.  On depo for 2 years w no periods and improved headaches on this medicine.  Concern for fibroids or cysts.  Ultrasound demonstrates no masses seen, no cysts  PMHx: She  has a past medical history of Family history of ovarian cancer. Also,  has a past surgical history that includes Cesarean section ([id number])., family history includes Alzheimer's disease in her paternal grandfather; Breast cancer in her maternal grandmother; Colon cancer in her maternal aunt; Dementia in her paternal grandfather; Diabetes in her paternal grandmother; Hyperlipidemia in her father; Hypertension in her father; Lupus in her sister; Ovarian cancer in her paternal aunt.,  reports that she has been smoking cigarettes. She has never used smokeless tobacco. She reports that she does not drink alcohol and does not use drugs.  She has a current medication list which includes the following prescription(s): bupropion and medroxyprogesterone acetate. Also, is allergic to bee venom and shellfish allergy.  Review of Systems  All other systems reviewed and are negative.   Objective: BP 120/80   Ht 5\' 9"  (1.753 m)   Wt (!) 212 lb (96.2 kg)   BMI 31.31 kg/m   Physical examination Constitutional NAD, Conversant  Skin No rashes, lesions or ulceration.   Extremities: Moves all appropriately.  Normal ROM for age. No lymphadenopathy.  Neuro: Grossly intact  Psych: Oriented to PPT.  Normal mood. Normal affect.   US PELVIC COMPLETE WITH TRANSVAGINAL  Result Date: 11/18/2019 Patient Name: Anita Lynch DOB: 08/12/1980 MRN: [id number] ULTRASOUND REPORT Location: [facility] Date of Service: 11/18/2019 Indications:Pelvic Pain Findings: The uterus is anteverted and measures 8.2 x 5.0 x 4.1 cm. Echo texture is heterogenous without evidence of focal masses. The Endometrium is ill-defined and measures 3.0 mm to 7.5 mm Right Ovary measures 3.0 x 1.8 x 2.2 cm. It is normal  in appearance. Left Ovary measures 2.5 x 2.4 x 1.3 cm. It is normal in appearance. Survey of the adnexa demonstrates no adnexal masses. There is no free fluid in the cul de sac. Impression: 1. Ill-defined endometrium. 2. Normal appearing ovaries. Recommendations: 1.Clinical correlation with the patient's History and Physical Exam. Gweneth Dimitri, RT Review of ULTRASOUND.    I have personally reviewed images and report of recent ultrasound done at [facility].    Plan of management to be discussed with patient. Barnett Applebaum, MD, [facility], [facility] 11/18/2019  8:50 AM   Assessment:  Pelvic pain No gyn source seen Discussed low likelihood for endometriosis, but this could only be dx by dx lap surgery, and not recommended at this time. Will cont Depo and monitor ex's over time  A total of 20 minutes were spent face-to-face with the patient as well as preparation, review, communication, and documentation during this encounter.   Barnett Applebaum, MD, [facility], [facility] 11/18/2019  8:53 AM

## 2019-11-18 NOTE — Patient Instructions (Signed)
Thank you for choosing [facility]. As part of our ongoing efforts to improve patient experience, we would appreciate your feedback. Please fill out the short survey that you will receive by mail or MyChart. Your opinion is important to us! -Dr Kathelene Rumberger  

## 2020-01-20 ENCOUNTER — Ambulatory Visit (INDEPENDENT_AMBULATORY_CARE_PROVIDER_SITE_OTHER): Payer: 59

## 2020-01-20 ENCOUNTER — Other Ambulatory Visit: Payer: Self-pay

## 2020-01-20 DIAGNOSIS — Z3042 Encounter for surveillance of injectable contraceptive: Secondary | ICD-10-CM

## 2020-01-20 MED ORDER — MEDROXYPROGESTERONE ACETATE 150 MG/ML IM SUSP
150.0000 mg | Freq: Once | INTRAMUSCULAR | Status: AC
  Administered 2020-01-20: 150 mg via INTRAMUSCULAR

## 2020-04-13 ENCOUNTER — Other Ambulatory Visit: Payer: Self-pay

## 2020-04-13 ENCOUNTER — Other Ambulatory Visit: Payer: Self-pay | Admitting: Obstetrics & Gynecology

## 2020-04-13 ENCOUNTER — Ambulatory Visit (INDEPENDENT_AMBULATORY_CARE_PROVIDER_SITE_OTHER): Payer: 59

## 2020-04-13 ENCOUNTER — Telehealth: Payer: Self-pay | Admitting: Obstetrics & Gynecology

## 2020-04-13 DIAGNOSIS — Z30013 Encounter for initial prescription of injectable contraceptive: Secondary | ICD-10-CM

## 2020-04-13 DIAGNOSIS — Z13220 Encounter for screening for lipoid disorders: Secondary | ICD-10-CM

## 2020-04-13 DIAGNOSIS — Z1329 Encounter for screening for other suspected endocrine disorder: Secondary | ICD-10-CM

## 2020-04-13 DIAGNOSIS — Z131 Encounter for screening for diabetes mellitus: Secondary | ICD-10-CM

## 2020-04-13 DIAGNOSIS — E559 Vitamin D deficiency, unspecified: Secondary | ICD-10-CM

## 2020-04-13 MED ORDER — MEDROXYPROGESTERONE ACETATE 150 MG/ML IM SUSP
150.0000 mg | Freq: Once | INTRAMUSCULAR | Status: AC
  Administered 2020-04-13: 150 mg via INTRAMUSCULAR

## 2020-04-13 NOTE — Telephone Encounter (Signed)
Called and left voicemail for patient to call back to be scheduled. 

## 2020-04-13 NOTE — Telephone Encounter (Signed)
Patient is requesting Fasting labs please place order so I can schedule, please advise. Thank you!

## 2020-05-20 DIAGNOSIS — Z20822 Contact with and (suspected) exposure to covid-19: Secondary | ICD-10-CM | POA: Diagnosis not present

## 2020-07-06 ENCOUNTER — Ambulatory Visit (INDEPENDENT_AMBULATORY_CARE_PROVIDER_SITE_OTHER): Payer: 59

## 2020-07-06 ENCOUNTER — Other Ambulatory Visit: Payer: Self-pay

## 2020-07-06 DIAGNOSIS — Z30013 Encounter for initial prescription of injectable contraceptive: Secondary | ICD-10-CM | POA: Diagnosis not present

## 2020-07-06 MED ORDER — MEDROXYPROGESTERONE ACETATE 150 MG/ML IM SUSP
150.0000 mg | Freq: Once | INTRAMUSCULAR | Status: AC
  Administered 2020-07-06: 150 mg via INTRAMUSCULAR

## 2020-07-06 NOTE — Progress Notes (Signed)
Patient presents today for Depo Provera injection within dates. Given IM RUOQ. Patient tolerated well. 

## 2020-07-19 ENCOUNTER — Other Ambulatory Visit: Payer: Self-pay | Admitting: Adult Health

## 2020-07-19 ENCOUNTER — Encounter: Payer: Self-pay | Admitting: Adult Health

## 2020-07-19 ENCOUNTER — Other Ambulatory Visit: Payer: Self-pay

## 2020-07-19 ENCOUNTER — Ambulatory Visit (INDEPENDENT_AMBULATORY_CARE_PROVIDER_SITE_OTHER): Payer: 59 | Admitting: Adult Health

## 2020-07-19 VITALS — BP 127/79 | HR 85 | Temp 98.8°F | Wt 202.0 lb

## 2020-07-19 DIAGNOSIS — J358 Other chronic diseases of tonsils and adenoids: Secondary | ICD-10-CM | POA: Diagnosis not present

## 2020-07-19 DIAGNOSIS — Z1159 Encounter for screening for other viral diseases: Secondary | ICD-10-CM | POA: Diagnosis not present

## 2020-07-19 DIAGNOSIS — F419 Anxiety disorder, unspecified: Secondary | ICD-10-CM | POA: Diagnosis not present

## 2020-07-19 DIAGNOSIS — Z6829 Body mass index (BMI) 29.0-29.9, adult: Secondary | ICD-10-CM | POA: Diagnosis not present

## 2020-07-19 DIAGNOSIS — J029 Acute pharyngitis, unspecified: Secondary | ICD-10-CM | POA: Insufficient documentation

## 2020-07-19 DIAGNOSIS — Z114 Encounter for screening for human immunodeficiency virus [HIV]: Secondary | ICD-10-CM | POA: Insufficient documentation

## 2020-07-19 DIAGNOSIS — Z0000 Encounter for general adult medical examination without abnormal findings: Secondary | ICD-10-CM | POA: Diagnosis not present

## 2020-07-19 MED ORDER — AMOXICILLIN-POT CLAVULANATE 875-125 MG PO TABS: 1.0000 | ORAL_TABLET | Freq: Two times a day (BID) | ORAL | 0 refills | Status: DC

## 2020-07-19 MED ORDER — BUPROPION HCL ER (SR) 150 MG PO TB12
150.0000 mg | ORAL_TABLET | Freq: Every day | ORAL | 1 refills | Status: DC
Start: 2020-07-19 — End: 2020-07-19

## 2020-07-19 NOTE — Patient Instructions (Addendum)
Health Maintenance, Female Adopting a healthy lifestyle and getting preventive care are important in promoting health and wellness. Ask your health ca http://[email or web address].[email or web address].Gov">  Generalized Anxiety Disorder, Adult Generalized anxiety disorder (GAD) is a mental health condition. Unlike normal worries, anxiety related to GAD is not triggered by a specific event. These worries do not fade or get better with time. GAD interferes with relationships, work, and school. GAD symptoms can vary from mild to severe. People with severe GAD can have intense waves of anxiety with physical symptoms that are similar to panic attacks. What are the causes? The exact cause of GAD is not known, but the following are believed to have an impact:  Differences in natural brain chemicals.  Genes passed down from parents to children.  Differences in the way threats are perceived.  Development during childhood.  Personality. What increases the risk? The following factors may make you more likely to develop this condition:  Being female.  Having a family history of anxiety disorders.  Being very shy.  Experiencing very stressful life events, such as the death of a loved one.  Having a very stressful family environment. What are the signs or symptoms? People with GAD often worry excessively about many things in their lives, such as their health and family. Symptoms may also include:  Mental and emotional symptoms: ? Worrying excessively about natural disasters. ? Fear of being late. ? Difficulty concentrating. ? Fears that others are judging your performance.  Physical symptoms: ? Fatigue. ? Headaches, muscle tension, muscle twitches, trembling, or feeling shaky. ? Feeling like your heart is pounding or beating very fast. ? Feeling out of breath or like you cannot take a deep breath. ? Having trouble falling asleep or staying asleep, or experiencing restlessness. ? Sweating. ? Nausea, diarrhea, or  irritable bowel syndrome (IBS).  Behavioral symptoms: ? Experiencing erratic moods or irritability. ? Avoidance of new situations. ? Avoidance of people. ? Extreme difficulty making decisions. How is this diagnosed? This condition is diagnosed based on your symptoms and medical history. You will also have a physical exam. Your health care provider may perform tests to rule out other possible causes of your symptoms. To be diagnosed with GAD, a person must have anxiety that:  Is out of his or her control.  Affects several different aspects of his or her life, such as work and relationships.  Causes distress that makes him or her unable to take part in normal activities.  Includes at least three symptoms of GAD, such as restlessness, fatigue, trouble concentrating, irritability, muscle tension, or sleep problems. Before your health care provider can confirm a diagnosis of GAD, these symptoms must be present more days than they are not, and they must last for 6 months or longer. How is this treated? This condition may be treated with:  Medicine. Antidepressant medicine is usually prescribed for long-term daily control. Anti-anxiety medicines may be added in severe cases, especially when panic attacks occur.  Talk therapy (psychotherapy). Certain types of talk therapy can be helpful in treating GAD by providing support, education, and guidance. Options include: ? Cognitive behavioral therapy (CBT). People learn coping skills and self-calming techniques to ease their physical symptoms. They learn to identify unrealistic thoughts and behaviors and to replace them with more appropriate thoughts and behaviors. ? Acceptance and commitment therapy (ACT). This treatment teaches people how to be mindful as a way to cope with unwanted thoughts and feelings. ? Biofeedback. This process trains you to manage your body's response (  physiological response) through breathing techniques and relaxation methods.  You will work with a therapist while machines are used to monitor your physical symptoms.  Stress management techniques. These include yoga, meditation, and exercise. A mental health specialist can help determine which treatment is best for you. Some people see improvement with one type of therapy. However, other people require a combination of therapies.   Follow these instructions at home: Lifestyle  Maintain a consistent routine and schedule.  Anticipate stressful situations. Create a plan, and allow extra time to work with your plan.  Practice stress management or self-calming techniques that you have learned from your therapist or your health care provider. General instructions  Take over-the-counter and prescription medicines only as told by your health care provider.  Understand that you are likely to have setbacks. Accept this and be kind to yourself as you persist to take better care of yourself.  Recognize and accept your accomplishments, even if you judge them as small.  Keep all follow-up visits as told by your health care provider. This is important. Contact a health care provider if:  Your symptoms do not get better.  Your symptoms get worse.  You have signs of depression, such as: ? A persistently sad or irritable mood. ? Loss of enjoyment in activities that used to bring you joy. ? Change in weight or eating. ? Changes in sleeping habits. ? Avoiding friends or family members. ? Loss of energy for normal tasks. ? Feelings of guilt or worthlessness. Get help right away if:  You have serious thoughts about hurting yourself or others. If you ever feel like you may hurt yourself or others, or have thoughts about taking your own life, get help right away. Go to your nearest emergency department or:  Call your local emergency services (911 in the [place]  Call a suicide crisis helpline, such as the [place] at 3217549869. This is open  24 hours a day in the [place]  Text the Crisis Text Line at 620-852-5794 (in the [place].). Summary  Generalized anxiety disorder (GAD) is a mental health condition that involves worry that is not triggered by a specific event.  People with GAD often worry excessively about many things in their lives, such as their health and family.  GAD may cause symptoms such as restlessness, trouble concentrating, sleep problems, frequent sweating, nausea, diarrhea, headaches, and trembling or muscle twitching.  A mental health specialist can help determine which treatment is best for you. Some people see improvement with one type of therapy. However, other people require a combination of therapies. This information is not intended to replace advice given to you by your health care provider. Make sure you discuss any questions you have with your health care provider. Document Revised: 01/29/2019 Document Reviewed: 01/29/2019 Elsevier Patient Education  2021 [place]. re provider about:  The right schedule for you to have regular tests and exams.  Things you can do on your own to prevent diseases and keep yourself healthy. What should I know about diet, weight, and exercise? Eat a healthy diet  Eat a diet that includes plenty of vegetables, fruits, low-fat dairy products, and lean protein. Do not eat a lot of foods that are high in solid fats, added sugars, or sodBupropion tablets (Depression/Mood Disorders) What is this medicine? BUPROPION (byoo PROE pee on) is used to treat depression. This medicine may be used for other purposes; ask your health care provider or pharmacist if you have questions. COMMON BRAND  NAME(S): Wellbutrin What should I tell my health care provider before I take this medicine? They need to know if you have any of these conditions: an eating disorder, such as anorexia or bulimia bipolar disorder or psychosis diabetes or high blood sugar, treated with medication glaucoma heart  disease, previous heart attack, or irregular heart beat head injury or brain tumor high blood pressure kidney or liver disease seizures suicidal thoughts or a previous suicide attempt Tourette's syndrome weight loss an unusual or allergic reaction to bupropion, other medicines, foods, dyes, or preservatives breast-feeding pregnant or trying to become pregnant How should I use this medicine? Take this medicine by mouth with a glass of water. Follow the directions on the prescription label. You can take it with or without food. If it upsets your stomach, take it with food. Take your medicine at regular intervals. Do not take your medicine more often than directed. Do not stop taking this medicine suddenly except upon the advice of your doctor. Stopping this medicine too quickly may cause serious side effects or your condition may worsen. A special MedGuide will be given to you by the pharmacist with each prescription and refill. Be sure to read this information carefully each time. Talk to your pediatrician regarding the use of this medicine in children. Special care may be needed. Overdosage: If you think you have taken too much of this medicine contact a poison control center or emergency room at once. NOTE: This medicine is only for you. Do not share this medicine with others. What if I miss a dose? If you miss a dose, take it as soon as you can. If it is less than four hours to your next dose, take only that dose and skip the missed dose. Do not take double or extra doses. What may interact with this medicine? Do not take this medicine with any of the following medications: linezolid MAOIs like Azilect, Carbex, Eldepryl, Marplan, Nardil, and Parnate methylene blue (injected into a vein) other medicines that contain bupropion like Zyban This medicine may also interact with the following medications: alcohol certain medicines for anxiety or sleep certain medicines for blood pressure like  metoprolol, propranolol certain medicines for depression or psychotic disturbances certain medicines for HIV or AIDS like efavirenz, lopinavir, nelfinavir, ritonavir certain medicines for irregular heart beat like propafenone, flecainide certain medicines for Parkinson's disease like amantadine, levodopa certain medicines for seizures like carbamazepine, phenytoin, phenobarbital cimetidine clopidogrel cyclophosphamide digoxin furazolidone isoniazid nicotine orphenadrine procarbazine steroid medicines like prednisone or cortisone stimulant medicines for attention disorders, weight loss, or to stay awake tamoxifen theophylline thiotepa ticlopidine tramadol warfarin This list may not describe all possible interactions. Give your health care provider a list of all the medicines, herbs, non-prescription drugs, or dietary supplements you use. Also tell them if you smoke, drink alcohol, or use illegal drugs. Some items may interact with your medicine. What should I watch for while using this medicine? Tell your doctor if your symptoms do not get better or if they get worse. Visit your doctor or healthcare provider for regular checks on your progress. Because it may take several weeks to see the full effects of this medicine, it is important to continue your treatment as prescribed by your doctor. This medicine may cause serious skin reactions. They can happen weeks to months after starting the medicine. Contact your healthcare provider right away if you notice fevers or flu-like symptoms with a rash. The rash may be red or purple and then turn into  blisters or peeling of the skin. Or, you might notice a red rash with swelling of the face, lips or lymph nodes in your neck or under your arms. Patients and their families should watch out for new or worsening thoughts of suicide or depression. Also watch out for sudden changes in feelings such as feeling anxious, agitated, panicky, irritable,  hostile, aggressive, impulsive, severely restless, overly excited and hyperactive, or not being able to sleep. If this happens, especially at the beginning of treatment or after a change in dose, call your healthcare provider. Avoid alcoholic drinks while taking this medicine. Drinking excessive alcoholic beverages, using sleeping or anxiety medicines, or quickly stopping the use of these agents while taking this medicine may increase your risk for a seizure. Do not drive or use heavy machinery until you know how this medicine affects you. This medicine can impair your ability to perform these tasks. Do not take this medicine close to bedtime. It may prevent you from sleeping. Your mouth may get dry. Chewing sugarless gum or sucking hard candy, and drinking plenty of water may help. Contact your doctor if the problem does not go away or is severe. What side effects may I notice from receiving this medicine? Side effects that you should report to your doctor or health care professional as soon as possible: allergic reactions like skin rash, itching or hives, swelling of the face, lips, or tongue breathing problems changes in vision confusion elevated mood, decreased need for sleep, racing thoughts, impulsive behavior fast or irregular heartbeat hallucinations, loss of contact with reality increased blood pressure rash, fever, and swollen lymph nodes redness, blistering, peeling, or loosening of the skin, including inside the mouth seizures suicidal thoughts or other mood changes unusually weak or tired vomiting Side effects that usually do not require medical attention (report to your doctor or health care professional if they continue or are bothersome): constipation headache loss of appetite nausea tremors weight loss This list may not describe all possible side effects. Call your doctor for medical advice about side effects. You may report side effects to FDA at 1-800-FDA-1088. Where  should I keep my medicine? Keep out of the reach of children. Store at room temperature between 20 and 25 degrees C (68 and 77 degrees F), away from direct sunlight and moisture. Keep tightly closed. Throw away any unused medicine after the expiration date. NOTE: This sheet is a summary. It may not cover all possible information. If you have questions about this medicine, talk to your doctor, pharmacist, or health care provider.  2021 Elsevier/Gold Standard (2018-07-04 14:02:47)  ium.   Maintain a healthy weight Body mass index (BMI) is used to identify weight problems. It estimates body fat based on height and weight. Your health care provider can help determine your BMI and help you achieve or maintain a healthy weight. Get regular exercise Get regular exercise. This is one of the most important things you can do for your health. Most adults should:  Exercise for at least 150 minutes each week. The exercise should increase your heart rate and make you sweat (moderate-intensity exercise).  Do strengthening exercises at least twice a week. This is in addition to the moderate-intensity exercise.  Spend less time sitting. Even light physical activity can be beneficial. Watch cholesterol and blood lipids Have your blood tested for lipids and cholesterol at 40 years of age, then have this test every 5 years. Have your cholesterol levels checked more often if:  Your lipid or cholesterol  levels are high.  You are older than 40 years of age.  You are at high risk for heart disease. What should I know about cancer screening? Depending on your health history and family history, you may need to have cancer screening at various ages. This may include screening for:  Breast cancer.  Cervical cancer.  Colorectal cancer.  Skin cancer.  Lung cancer. What should I know about heart disease, diabetes, and high blood pressure? Blood pressure and heart disease  High blood pressure causes heart  disease and increases the risk of stroke. This is more likely to develop in people who have high blood pressure readings, are of African descent, or are overweight.  Have your blood pressure checked: ? Every 3-5 years if you are 73-10 years of age. ? Every year if you are 105 years old or older. Diabetes Have regular diabetes screenings. This checks your fasting blood sugar level. Have the screening done:  Once every three years after age 37 if you are at a normal weight and have a low risk for diabetes.  More often and at a younger age if you are overweight or have a high risk for diabetes. What should I know about preventing infection? Hepatitis B If you have a higher risk for hepatitis B, you should be screened for this virus. Talk with your health care provider to find out if you are at risk for hepatitis B infection. Hepatitis C Testing is recommended for:  Everyone born from [birth year] through 1965.  Anyone with known risk factors for hepatitis C. Sexually transmitted infections (STIs)  Get screened for STIs, including gonorrhea and chlamydia, if: ? You are sexually active and are younger than 40 years of age. ? You are older than 40 years of age and your health care provider tells you that you are at risk for this type of infection. ? Your sexual activity has changed since you were last screened, and you are at increased risk for chlamydia or gonorrhea. Ask your health care provider if you are at risk.  Ask your health care provider about whether you are at high risk for HIV. Your health care provider may recommend a prescription medicine to help prevent HIV infection. If you choose to take medicine to prevent HIV, you should first get tested for HIV. You should then be tested every 3 months for as long as you are taking the medicine. Pregnancy  If you are about to stop having your period (premenopausal) and you may become pregnant, seek counseling before you get pregnant.  Take 400  to 800 micrograms (mcg) of folic acid every day if you become pregnant.  Ask for birth control (contraception) if you want to prevent pregnancy. Osteoporosis and menopause Osteoporosis is a disease in which the bones lose minerals and strength with aging. This can result in bone fractures. If you are 70 years old or older, or if you are at risk for osteoporosis and fractures, ask your health care provider if you should:  Be screened for bone loss.  Take a calcium or vitamin D supplement to lower your risk of fractures.  Be given hormone replacement therapy (HRT) to treat symptoms of menopause. Follow these instructions at home: Lifestyle  Do not use any products that contain nicotine or tobacco, such as cigarettes, e-cigarettes, and chewing tobacco. If you need help quitting, ask your health care provider.  Do not use street drugs.  Do not share needles.  Ask your health care provider for help  if you need support or information about quitting drugs. Alcohol use  Do not drink alcohol if: ? Your health care provider tells you not to drink. ? You are pregnant, may be pregnant, or are planning to become pregnant.  If you drink alcohol: ? Limit how much you use to 0-1 drink a day. ? Limit intake if you are breastfeeding.  Be aware of how much alcohol is in your drink. In the [place] one drink equals one 12 oz bottle of beer (355 mL), one 5 oz glass of wine (148 mL), or one 1 oz glass of hard liquor (44 mL). General instructions  Schedule regular health, dental, and eye exams.  Stay current with your vaccines.  Tell your health care provider if: ? You often feel depressed. ? You have ever been abused or do not feel safe at home. Summary  Adopting a healthy lifestyle and getting preventive care are important in promoting health and wellness.  Follow your health care provider's instructions about healthy diet, exercising, and getting tested or screened for diseases.  Follow your  health care provider's instructions on monitoring your cholesterol and blood pressure. This information is not intended to replace advice given to you by your health care provider. Make sure you discuss any questions you have with your health care provider. Document Revised: 04/03/2018 Document Reviewed: 04/03/2018 Elsevier Patient Education  2021 [place] for [employer] Calories are units of energy. Your body needs a certain number of calories from food to keep going throughout the day. When you eat or drink more calories than your body needs, your body stores the extra calories mostly as fat. When you eat or drink fewer calories than your body needs, your body burns fat to get the energy it needs. Calorie counting means keeping track of how many calories you eat and drink each day. Calorie counting can be helpful if you need to lose weight. If you eat fewer calories than your body needs, you should lose weight. Ask your health care provider what a healthy weight is for you. For calorie counting to work, you will need to eat the right number of calories each day to lose a healthy amount of weight per week. A dietitian can help you figure out how many calories you need in a day and will suggest ways to reach your calorie goal.  A healthy amount of weight to lose each week is usually 1-2 lb (0.5-0.9 kg). This usually means that your daily calorie intake should be reduced by 500-750 calories.  Eating 1,200-1,500 calories a day can help most women lose weight.  Eating 1,500-1,800 calories a day can help most men lose weight. What do I need to know about calorie counting? Work with your health care provider or dietitian to determine how many calories you should get each day. To meet your daily calorie goal, you will need to:  Find out how many calories are in each food that you would like to eat. Try to do this before you eat.  Decide how much of the food you plan to  eat.  Keep a food log. Do this by writing down what you ate and how many calories it had. To successfully lose weight, it is important to balance calorie counting with a healthy lifestyle that includes regular activity. Where do I find calorie information? The number of calories in a food can be found on a Nutrition Facts label. If a food does not have  a Nutrition Facts label, try to look up the calories online or ask your dietitian for help. Remember that calories are listed per serving. If you choose to have more than one serving of a food, you will have to multiply the calories per serving by the number of servings you plan to eat. For example, the label on a package of bread might say that a serving size is 1 slice and that there are 90 calories in a serving. If you eat 1 slice, you will have eaten 90 calories. If you eat 2 slices, you will have eaten 180 calories.   How do I keep a food log? After each time that you eat, record the following in your food log as soon as possible:  What you ate. Be sure to include toppings, sauces, and other extras on the food.  How much you ate. This can be measured in cups, ounces, or number of items.  How many calories were in each food and drink.  The total number of calories in the food you ate. Keep your food log near you, such as in a pocket-sized notebook or on an app or website on your mobile phone. Some programs will calculate calories for you and show you how many calories you have left to meet your daily goal. What are some portion-control tips?  Know how many calories are in a serving. This will help you know how many servings you can have of a certain food.  Use a measuring cup to measure serving sizes. You could also try weighing out portions on a kitchen scale. With time, you will be able to estimate serving sizes for some foods.  Take time to put servings of different foods on your favorite plates or in your favorite bowls and cups so you  know what a serving looks like.  Try not to eat straight from a food's packaging, such as from a bag or box. Eating straight from the package makes it hard to see how much you are eating and can lead to overeating. Put the amount you would like to eat in a cup or on a plate to make sure you are eating the right portion.  Use smaller plates, glasses, and bowls for smaller portions and to prevent overeating.  Try not to multitask. For example, avoid watching TV or using your computer while eating. If it is time to eat, sit down at a table and enjoy your food. This will help you recognize when you are full. It will also help you be more mindful of what and how much you are eating. What are tips for following this plan? Reading food labels  Check the calorie count compared with the serving size. The serving size may be smaller than what you are used to eating.  Check the source of the calories. Try to choose foods that are high in protein, fiber, and vitamins, and low in saturated fat, trans fat, and sodium. Shopping  Read nutrition labels while you shop. This will help you make healthy decisions about which foods to buy.  Pay attention to nutrition labels for low-fat or fat-free foods. These foods sometimes have the same number of calories or more calories than the full-fat versions. They also often have added sugar, starch, or salt to make up for flavor that was removed with the fat.  Make a grocery list of lower-calorie foods and stick to it. Cooking  Try to cook your favorite foods in a healthier way. For  example, try baking instead of frying.  Use low-fat dairy products. Meal planning  Use more fruits and vegetables. One-half of your plate should be fruits and vegetables.  Include lean proteins, such as chicken, [place], and fish. Lifestyle Each week, aim to do one of the following:  150 minutes of moderate exercise, such as walking.  75 minutes of vigorous exercise, such as  running. General information  Know how many calories are in the foods you eat most often. This will help you calculate calorie counts faster.  Find a way of tracking calories that works for you. Get creative. Try different apps or programs if writing down calories does not work for you. What foods should I eat?  Eat nutritious foods. It is better to have a nutritious, high-calorie food, such as an avocado, than a food with few nutrients, such as a bag of potato chips.  Use your calories on foods and drinks that will fill you up and will not leave you hungry soon after eating. ? Examples of foods that fill you up are nuts and nut butters, vegetables, lean proteins, and high-fiber foods such as whole grains. High-fiber foods are foods with more than 5 g of fiber per serving.  Pay attention to calories in drinks. Low-calorie drinks include water and unsweetened drinks. The items listed above may not be a complete list of foods and beverages you can eat. Contact a dietitian for more information.   What foods should I limit? Limit foods or drinks that are not good sources of vitamins, minerals, or protein or that are high in unhealthy fats. These include:  Candy.  Other sweets.  Sodas, specialty coffee drinks, alcohol, and juice. The items listed above may not be a complete list of foods and beverages you should avoid. Contact a dietitian for more information. How do I count calories when eating out?  Pay attention to portions. Often, portions are much larger when eating out. Try these tips to keep portions smaller: ? Consider sharing a meal instead of getting your own. ? If you get your own meal, eat only half of it. Before you start eating, ask for a container and put half of your meal into it. ? When available, consider ordering smaller portions from the menu instead of full portions.  Pay attention to your food and drink choices. Knowing the way food is cooked and what is included with  the meal can help you eat fewer calories. ? If calories are listed on the menu, choose the lower-calorie options. ? Choose dishes that include vegetables, fruits, whole grains, low-fat dairy products, and lean proteins. ? Choose items that are boiled, broiled, grilled, or steamed. Avoid items that are buttered, battered, fried, or served with cream sauce. Items labeled as crispy are usually fried, unless stated otherwise. ? Choose water, low-fat milk, unsweetened iced tea, or other drinks without added sugar. If you want an alcoholic beverage, choose a lower-calorie option, such as a glass of wine or light beer. ? Ask for dressings, sauces, and syrups on the side. These are usually high in calories, so you should limit the amount you eat. ? If you want a salad, choose a garden salad and ask for grilled meats. Avoid extra toppings such as bacon, cheese, or fried items. Ask for the dressing on the side, or ask for olive oil and vinegar or lemon to use as dressing.  Estimate how many servings of a food you are given. Knowing serving sizes will help you  be aware of how much food you are eating at restaurants. Where to find more information  Centers for Disease Control and Prevention: http://www.wolf.info/  [place]. Department of Agriculture: http://www.wilson-mendoza.org/ Summary  Calorie counting means keeping track of how many calories you eat and drink each day. If you eat fewer calories than your body needs, you should lose weight.  A healthy amount of weight to lose per week is usually 1-2 lb (0.5-0.9 kg). This usually means reducing your daily calorie intake by 500-750 calories.  The number of calories in a food can be found on a Nutrition Facts label. If a food does not have a Nutrition Facts label, try to look up the calories online or ask your dietitian for help.  Use smaller plates, glasses, and bowls for smaller portions and to prevent overeating.  Use your calories on foods and drinks that will fill you up and not  leave you hungry shortly after a meal. This information is not intended to replace advice given to you by your health care provider. Make sure you discuss any questions you have with your health care provider. Document Revised: 05/22/2019 Document Reviewed: 05/22/2019 Elsevier Patient Education  2021 [place] and Cholesterol Restricted Eating Plan Getting too much fat and cholesterol in your diet may cause health problems. Choosing the right foods helps keep your fat and cholesterol at normal levels. This can keep you from getting certain diseases. Your doctor may recommend an eating plan that includes:  Total fat: ______% or less of total calories a day.  Saturated fat: ______% or less of total calories a day.  Cholesterol: less than _________mg a day.  Fiber: ______g a day. What are tips for following this plan? Meal planning  At meals, divide your plate into four equal parts: ? Fill one-half of your plate with vegetables and green salads. ? Fill one-fourth of your plate with whole grains. ? Fill one-fourth of your plate with low-fat (lean) protein foods.  Eat fish that is high in omega-3 fats at least two times a week. This includes mackerel, tuna, sardines, and salmon.  Eat foods that are high in fiber, such as whole grains, beans, apples, broccoli, carrots, peas, and barley. General tips  Work with your doctor to lose weight if you need to.  Avoid: ? Foods with added sugar. ? Fried foods. ? Foods with partially hydrogenated oils.  Limit alcohol intake to no more than 1 drink a day for nonpregnant women and 2 drinks a day for men. One drink equals 12 oz of beer, 5 oz of wine, or 1 oz of hard liquor.   Reading food labels  Check food labels for: ? Trans fats. ? Partially hydrogenated oils. ? Saturated fat (g) in each serving. ? Cholesterol (mg) in each serving. ? Fiber (g) in each serving.  Choose foods with healthy fats, such as: ? Monounsaturated  fats. ? Polyunsaturated fats. ? Omega-3 fats.  Choose grain products that have whole grains. Look for the word "whole" as the first word in the ingredient list. Cooking  Cook foods using low-fat methods. These include baking, boiling, grilling, and broiling.  Eat more home-cooked foods. Eat at restaurants and buffets less often.  Avoid cooking using saturated fats, such as butter, cream, palm oil, palm kernel oil, and coconut oil. Recommended foods Fruits  All fresh, canned (in natural juice), or frozen fruits. Vegetables  Fresh or frozen vegetables (raw, steamed, roasted, or grilled). Green salads. Grains  Whole grains, such  as whole wheat or whole grain breads, crackers, cereals, and pasta. Unsweetened oatmeal, bulgur, barley, quinoa, or brown rice. Corn or whole wheat flour tortillas. Meats and other protein foods  Ground beef (85% or leaner), grass-fed beef, or beef trimmed of fat. Skinless chicken or [place]. Ground chicken or [place]. Pork trimmed of fat. All fish and seafood. Egg whites. Dried beans, peas, or lentils. Unsalted nuts or seeds. Unsalted canned beans. Nut butters without added sugar or oil. Dairy  Low-fat or nonfat dairy products, such as skim or 1% milk, 2% or reduced-fat cheeses, low-fat and fat-free ricotta or cottage cheese, or plain low-fat and nonfat yogurt. Fats and oils  Tub margarine without trans fats. Light or reduced-fat mayonnaise and salad dressings. Avocado. Olive, canola, sesame, or safflower oils. The items listed above may not be a complete list of foods and beverages you can eat. Contact a dietitian for more information.   Foods to avoid Fruits  Canned fruit in heavy syrup. Fruit in cream or butter sauce. Fried fruit. Vegetables  Vegetables cooked in cheese, cream, or butter sauce. Fried vegetables. Grains  White bread. White pasta. White rice. Cornbread. Bagels, pastries, and croissants. Crackers and snack foods that contain trans fat and  hydrogenated oils. Meats and other protein foods  Fatty cuts of meat. Ribs, chicken wings, bacon, sausage, bologna, salami, chitterlings, fatback, hot dogs, bratwurst, and packaged lunch meats. Liver and organ meats. Whole eggs and egg yolks. Chicken and [place] with skin. Fried meat. Dairy  Whole or 2% milk, cream, half-and-half, and cream cheese. Whole milk cheeses. Whole-fat or sweetened yogurt. Full-fat cheeses. Nondairy creamers and whipped toppings. Processed cheese, cheese spreads, and cheese curds. Beverages  Alcohol. Sugar-sweetened drinks such as sodas, lemonade, and fruit drinks. Fats and oils  Butter, stick margarine, lard, shortening, ghee, or bacon fat. Coconut, palm kernel, and palm oils. Sweets and desserts  Corn syrup, sugars, honey, and molasses. Candy. Jam and jelly. Syrup. Sweetened cereals. Cookies, pies, cakes, donuts, muffins, and ice cream. The items listed above may not be a complete list of foods and beverages you should avoid. Contact a dietitian for more information. Summary  Choosing the right foods helps keep your fat and cholesterol at normal levels. This can keep you from getting certain diseases.  At meals, fill one-half of your plate with vegetables and green salads.  Eat high-fiber foods, like whole grains, beans, apples, carrots, peas, and barley.  Limit added sugar, saturated fats, alcohol, and fried foods. This information is not intended to replace advice given to you by your health care provider. Make sure you discuss any questions you have with your health care provider. Document Revised: 08/13/2019 Document Reviewed: 08/13/2019 Elsevier Patient Education  2021 [employer].

## 2020-07-19 NOTE — Progress Notes (Signed)
Complete physical exam   Patient: Anita Lynch   DOB: January 14, 1981   40 y.o. Female  MRN: [id number] Visit Date: 07/19/2020  Today's healthcare provider: Marcille Buffy, FNP   No chief complaint on file.  Subjective    Anita Lynch is a 40 y.o. female who presents today for a complete physical exam.  Patient has been seeing her Ob/Gyn.  She is up to date with her pap smear and has no complaints today. HPI  Pap normal.    She reports had laryngitis 1 year ago and has noticed a white area on her tonsil that is raised and soft ever since. She has not seen anyone for this. Denies historty of tonsil issues  or stones.  Needs refill on Wellbutrin taking once a day. Denies any suicidal or homicidal ideations or intents. Has been taking for 2 years.   Patient  denies any fever, body aches,chills, rash, chest pain, shortness of breath, nausea, vomiting, or diarrhea.  Denies dizziness, lightheadedness, pre syncopal or syncopal episodes.    Past Medical History:  Diagnosis Date  . Allergy   . Anemia   . Anxiety   . Family history of ovarian cancer    7/21 cancer genetic testing letter sent   Past Surgical History:  Procedure Laterality Date  . CESAREAN SECTION  2002,2012   Social History   Socioeconomic History  . Marital status: Single    Spouse name: Not on file  . Number of children: Not on file  . Years of education: Not on file  . Highest education level: Not on file  Occupational History  . Not on file  Tobacco Use  . Smoking status: Current Every Day Smoker    Types: Cigarettes  . Smokeless tobacco: Never Used  Vaping Use  . Vaping Use: Never used  Substance and Sexual Activity  . Alcohol use: No    Alcohol/week: 0.0 standard drinks  . Drug use: No  . Sexual activity: Yes    Birth control/protection: Surgical, Injection  Other Topics Concern  . Not on file  Social History Narrative  . Not on file   Social Determinants of Health    Financial Resource Strain: Not on file  Food Insecurity: Not on file  Transportation Needs: Not on file  Physical Activity: Not on file  Stress: Not on file  Social Connections: Not on file  Intimate Partner Violence: Not on file   Family Status  Relation Name Status  . Mother  Alive  . Father  Alive  . Sister  Alive  . Brother  Alive  . Daughter  Alive  . Son  Alive  . MGM  Deceased  . MGF  Other  . PGM  Deceased  . PGF  Deceased  . Ethlyn Daniels  (Not Specified)  . Mat Aunt  (Not Specified)   Family History  Problem Relation Age of Onset  . Hypertension Mother   . Hyperlipidemia Father   . Hypertension Father   . Lupus Sister   . Breast cancer Maternal Grandmother        ? age  . Diabetes Paternal Grandmother   . Dementia Paternal Grandfather   . Alzheimer's disease Paternal Grandfather   . Ovarian cancer Paternal Aunt   . Colon cancer Maternal Aunt    Allergies  Allergen Reactions  . Bee Venom Swelling    Bees Bees  . Shellfish Allergy Swelling    Patient Care Team: Doreen Beam,  FNP as PCP - General (Family Medicine)   Medications: Outpatient Medications Prior to Visit  Medication Sig  . medroxyPROGESTERone Acetate 150 MG/ML SUSY Inject 1 mL (150 mg total) into the muscle every 3 (three) months.  . [DISCONTINUED] buPROPion (WELLBUTRIN SR) 150 MG 12 hr tablet Start at one pill daily for a week then twice daily. (Patient not taking: Reported on 07/19/2020)   No facility-administered medications prior to visit.    Review of Systems  Constitutional: Negative.   HENT: Positive for rhinorrhea and sneezing.   Eyes: Negative.   Respiratory: Negative.   Cardiovascular: Negative.   Gastrointestinal: Negative.   Endocrine: Negative.   Genitourinary: Negative.   Musculoskeletal: Negative.   Skin: Negative.   Allergic/Immunologic: Negative.   Neurological: Negative.   Hematological: Negative.   Psychiatric/Behavioral: Negative.       Objective     BP 127/79 (BP Location: Left Arm, Patient Position: Sitting, Cuff Size: Normal)   Pulse 85   Temp 98.8 F (37.1 C) (Oral)   Wt 202 lb (91.6 kg)   SpO2 100%   BMI 29.83 kg/m    Physical Exam Vitals reviewed.  Constitutional:      Appearance: Normal appearance. She is normal weight.  HENT:     Head: Normocephalic and atraumatic.     Jaw: There is normal jaw occlusion.     Right Ear: Tympanic membrane, ear canal and external ear normal.     Left Ear: Tympanic membrane, ear canal and external ear normal.     Nose: Nose normal.     Right Sinus: No maxillary sinus tenderness or frontal sinus tenderness.     Left Sinus: No maxillary sinus tenderness or frontal sinus tenderness.     Mouth/Throat:     Lips: Pink.     Mouth: Mucous membranes are moist.     Pharynx: Uvula midline.     Tonsils: Tonsillar exudate present. 2+ on the right. 2+ on the left.     Comments: Small white raised area on left tonsil present x 1 year per patient.  Eyes:     Extraocular Movements: Extraocular movements intact.     Conjunctiva/sclera: Conjunctivae normal.     Pupils: Pupils are equal, round, and reactive to light.  Cardiovascular:     Rate and Rhythm: Normal rate and regular rhythm.     Pulses: Normal pulses.     Heart sounds: Normal heart sounds.  Pulmonary:     Effort: Pulmonary effort is normal.     Breath sounds: Normal breath sounds.  Abdominal:     General: Abdomen is flat. Bowel sounds are normal.     Palpations: Abdomen is soft.  Musculoskeletal:        General: Normal range of motion.     Cervical back: Normal range of motion and neck supple.  Skin:    General: Skin is warm and dry.  Neurological:     General: No focal deficit present.     Mental Status: She is alert and oriented to person, place, and time. Mental status is at baseline.  Psychiatric:        Mood and Affect: Mood normal.        Behavior: Behavior normal.        Thought Content: Thought content normal.         Judgment: Judgment normal.     Last depression screening scores PHQ 2/9 Scores 07/19/2020 05/02/2017  PHQ - 2 Score 2 1  PHQ- 9 Score 7 -  Last fall risk screening Fall Risk  07/19/2020  Falls in the past year? 0  Number falls in past yr: 0  Injury with Fall? 0   Last Audit-C alcohol use screening Alcohol Use Disorder Test (AUDIT) 07/19/2020  1. How often do you have a drink containing alcohol? 0  2. How many drinks containing alcohol do you have on a typical day when you are drinking? 0  3. How often do you have six or more drinks on one occasion? 0  AUDIT-C Score 0  Alcohol Brief Interventions/Follow-up AUDIT Score <7 follow-up not indicated   A score of 3 or more in women, and 4 or more in men indicates increased risk for alcohol abuse, EXCEPT if all of the points are from question 1   No results found for any visits on 07/19/20.  Assessment & Plan    Routine Health Maintenance and Physical Exam  Exercise Activities and Dietary recommendations Goals   None     Immunization History  Administered Date(s) Administered  . Hepatitis B 05/22/2005  . PFIZER(Purple Top)SARS-COV-2 Vaccination 11/14/2019  . Td 05/02/2017    Health Maintenance  Topic Date Due  . Hepatitis C Screening  Never done  . INFLUENZA VACCINE  11/23/2019  . COVID-19 Vaccine (2 - Pfizer 3-dose series) 12/05/2019  . PAP SMEAR-Modifier  10/28/2022  . TETANUS/TDAP  05/03/2027  . HIV Screening  Completed  . HPV VACCINES  Aged Out    Discussed health benefits of physical activity, and encouraged her to engage in regular exercise appropriate for her age and condition.  Routine health maintenance  Anxiety - Plan: CBC with Differential/Platelet, Comprehensive metabolic panel, Lipid Panel With LDL/HDL Ratio, TSH, HIV Antibody (routine testing w rflx), buPROPion (WELLBUTRIN SR) 150 MG 12 hr tablet  Need for hepatitis C screening test - Plan: Hepatitis C antibody  Screening for HIV (human immunodeficiency  virus) - Plan: HIV Antibody (routine testing w rflx)  Tonsil stone - Plan: Ambulatory referral to ENT  Pharyngitis, unspecified etiology - Plan: amoxicillin-clavulanate (AUGMENTIN) 875-125 MG tablet, Ambulatory referral to ENT  Orders Placed This Encounter  Procedures  . CBC with Differential/Platelet  . Comprehensive metabolic panel  . Lipid Panel With LDL/HDL Ratio  . TSH  . HIV Antibody (routine testing w rflx)  . Hepatitis C antibody  . Ambulatory referral to ENT    Referral Priority:   Routine    Referral Type:   Consultation    Referral Reason:   Specialty Services Required    Requested Specialty:   Otolaryngology    Number of Visits Requested:   1   Meds ordered this encounter  Medications  . buPROPion (WELLBUTRIN SR) 150 MG 12 hr tablet    Sig: Take 1 tablet (150 mg total) by mouth daily.    Dispense:  90 tablet    Refill:  1    For smoking cessation  . amoxicillin-clavulanate (AUGMENTIN) 875-125 MG tablet    Sig: Take 1 tablet by mouth 2 (two) times daily.    Dispense:  20 tablet    Refill:  0   Call if not heard from gynecology.    Discussed known black box warning for anti depression/ anxiety medication. Need to report any behavioral changes right, if any homicidal or suicidal thoughts or ideas seek medical attention right away. Call 911.   Red Flags discussed. The patient was given clear instructions to go to ER or return to medical center if any red flags develop, symptoms do not  improve, worsen or new problems develop. They verbalized understanding.   Return in about 6 months (around 01/19/2021), or if symptoms worsen or fail to improve, for at any time for any worsening symptoms, Go to Emergency room/ urgent care if worse.    The entirety of the information documented in the History of Present Illness, Review of Systems and Physical Exam were personally obtained by me. Portions of this information were initially documented by the CMA and reviewed by me for  thoroughness and accuracy.   Marcille Buffy, [place] [phone] (phone) [phone] (fax)  [place]

## 2020-08-16 DIAGNOSIS — D104 Benign neoplasm of tonsil: Secondary | ICD-10-CM | POA: Diagnosis not present

## 2020-08-16 DIAGNOSIS — J36 Peritonsillar abscess: Secondary | ICD-10-CM | POA: Diagnosis not present

## 2020-09-27 ENCOUNTER — Other Ambulatory Visit: Payer: Self-pay

## 2020-09-27 MED FILL — Medroxyprogesterone Acetate IM Susp Prefilled Syr 150 MG/ML: INTRAMUSCULAR | 90 days supply | Qty: 1 | Fill #0 | Status: AC

## 2020-09-28 ENCOUNTER — Ambulatory Visit (INDEPENDENT_AMBULATORY_CARE_PROVIDER_SITE_OTHER): Payer: 59

## 2020-09-28 ENCOUNTER — Other Ambulatory Visit: Payer: Self-pay

## 2020-09-28 DIAGNOSIS — Z30013 Encounter for initial prescription of injectable contraceptive: Secondary | ICD-10-CM | POA: Diagnosis not present

## 2020-09-28 MED ORDER — MEDROXYPROGESTERONE ACETATE 150 MG/ML IM SUSP
150.0000 mg | Freq: Once | INTRAMUSCULAR | Status: AC
  Administered 2020-09-28: 150 mg via INTRAMUSCULAR

## 2020-09-28 NOTE — Progress Notes (Signed)
Pt here for depo which was given IM right glut.  NDC# 5146-0479-98

## 2020-11-12 ENCOUNTER — Encounter: Payer: Self-pay | Admitting: Obstetrics & Gynecology

## 2020-11-12 ENCOUNTER — Other Ambulatory Visit: Payer: Self-pay

## 2020-11-12 ENCOUNTER — Ambulatory Visit (INDEPENDENT_AMBULATORY_CARE_PROVIDER_SITE_OTHER): Payer: 59 | Admitting: Obstetrics & Gynecology

## 2020-11-12 VITALS — BP 120/80 | Ht 69.0 in | Wt 185.0 lb

## 2020-11-12 DIAGNOSIS — Z01419 Encounter for gynecological examination (general) (routine) without abnormal findings: Secondary | ICD-10-CM | POA: Diagnosis not present

## 2020-11-12 DIAGNOSIS — Z1321 Encounter for screening for nutritional disorder: Secondary | ICD-10-CM

## 2020-11-12 DIAGNOSIS — Z131 Encounter for screening for diabetes mellitus: Secondary | ICD-10-CM | POA: Diagnosis not present

## 2020-11-12 DIAGNOSIS — G4489 Other headache syndrome: Secondary | ICD-10-CM | POA: Diagnosis not present

## 2020-11-12 DIAGNOSIS — Z1329 Encounter for screening for other suspected endocrine disorder: Secondary | ICD-10-CM | POA: Diagnosis not present

## 2020-11-12 DIAGNOSIS — Z13220 Encounter for screening for lipoid disorders: Secondary | ICD-10-CM | POA: Diagnosis not present

## 2020-11-12 DIAGNOSIS — Z1231 Encounter for screening mammogram for malignant neoplasm of breast: Secondary | ICD-10-CM | POA: Diagnosis not present

## 2020-11-12 MED ORDER — MEDROXYPROGESTERONE ACETATE 150 MG/ML IM SUSY
PREFILLED_SYRINGE | INTRAMUSCULAR | 3 refills | Status: DC
  Filled 2020-11-12: qty 1, fill #0
  Filled 2020-12-20: qty 1, 90d supply, fill #0
  Filled 2021-03-14: qty 1, 84d supply, fill #1
  Filled 2021-06-06: qty 1, 84d supply, fill #2
  Filled 2021-08-19: qty 1, 84d supply, fill #3
  Filled 2021-08-19: qty 1, 90d supply, fill #3
  Filled 2021-08-19: qty 1, 84d supply, fill #3

## 2020-11-12 NOTE — Progress Notes (Signed)
HPI:      [name] is a 40 y.o. [phone] who LMP was No LMP recorded. Patient has had an injection., she presents today for her annual examination. The patient has no complaints today. The patient is sexually active. Her last pap: approximate date 2021 and was normal. The patient does perform self breast exams.  There is no notable family history of breast or ovarian cancer in her family.  The patient has regular exercise: yes.  The patient denies current symptoms of depression.    GYN History: Contraception: Depo-Provera injections year 6; no periods  PMHx: Past Medical History:  Diagnosis Date   Allergy    Anemia    Anxiety    Family history of ovarian cancer    7/21 cancer genetic testing letter sent   Past Surgical History:  Procedure Laterality Date   CESAREAN SECTION  2002,2012   Family History  Problem Relation Age of Onset   Hypertension Mother    Hyperlipidemia Father    Hypertension Father    Lupus Sister    Breast cancer Maternal Grandmother        ? age   Diabetes Paternal Grandmother    Dementia Paternal Grandfather    Alzheimer's disease Paternal Grandfather    Ovarian cancer Paternal Aunt    Colon cancer Maternal Aunt    Social History   Tobacco Use   Smoking status: Every Day    Types: Cigarettes   Smokeless tobacco: Never  Vaping Use   Vaping Use: Never used  Substance Use Topics   Alcohol use: No    Alcohol/week: 0.0 standard drinks   Drug use: No    Current Outpatient Medications:    buPROPion (WELLBUTRIN SR) 150 MG 12 hr tablet, TAKE 1 TABLET BY MOUTH DAILY., Disp: 90 tablet, Rfl: 1   medroxyPROGESTERone Acetate 150 MG/ML SUSY, INJECT 1 MILLILITER (ML) INTO THE MUSCLE EVERY 3 MONTHS, Disp: 1 mL, Rfl: 3 Allergies: Bee venom and Shellfish allergy  Review of Systems  Constitutional:  Negative for chills, fever and malaise/fatigue.  HENT:  Negative for congestion, sinus pain and sore throat.   Eyes:  Negative for blurred vision  and pain.  Respiratory:  Negative for cough and wheezing.   Cardiovascular:  Negative for chest pain and leg swelling.  Gastrointestinal:  Negative for abdominal pain, constipation, diarrhea, heartburn, nausea and vomiting.  Genitourinary:  Negative for dysuria, frequency, hematuria and urgency.  Musculoskeletal:  Negative for back pain, joint pain, myalgias and neck pain.  Skin:  Negative for itching and rash.  Neurological:  Negative for dizziness, tremors and weakness.  Endo/Heme/Allergies:  Does not bruise/bleed easily.  Psychiatric/Behavioral:  Negative for depression. The patient is not nervous/anxious and does not have insomnia.    Objective: BP 120/80   Ht '5\' 9"'$  (1.753 m)   Wt 185 lb (83.9 kg)   BMI 27.32 kg/m   Filed Weights   11/12/20 0940  Weight: 185 lb (83.9 kg)   Body mass index is 27.32 kg/m. Physical Exam Constitutional:      General: She is not in acute distress.    Appearance: She is well-developed.  Genitourinary:     Bladder, rectum and urethral meatus normal.     No lesions in the vagina.     Right Labia: No rash, tenderness or lesions.    Left Labia: No tenderness, lesions or rash.    No vaginal bleeding.      Right Adnexa: not tender and no  mass present.    Left Adnexa: not tender and no mass present.    No cervical motion tenderness, friability, lesion or polyp.     Uterus is not enlarged.     No uterine mass detected.    Pelvic exam was performed with patient in the lithotomy position.  Breasts:    Right: No mass, skin change or tenderness.     Left: No mass, skin change or tenderness.  HENT:     Head: Normocephalic and atraumatic. No laceration.     Right Ear: Hearing normal.     Left Ear: Hearing normal.     Mouth/Throat:     Pharynx: Uvula midline.  Eyes:     Pupils: Pupils are equal, round, and reactive to light.  Neck:     Thyroid: No thyromegaly.  Cardiovascular:     Rate and Rhythm: Normal rate and regular rhythm.     Heart sounds:  No murmur heard.   No friction rub. No gallop.  Pulmonary:     Effort: Pulmonary effort is normal. No respiratory distress.     Breath sounds: Normal breath sounds. No wheezing.  Abdominal:     General: Bowel sounds are normal. There is no distension.     Palpations: Abdomen is soft.     Tenderness: There is no abdominal tenderness. There is no rebound.  Musculoskeletal:        General: Normal range of motion.     Cervical back: Normal range of motion and neck supple.  Neurological:     Mental Status: She is alert and oriented to person, place, and time.     Cranial Nerves: No cranial nerve deficit.  Skin:    General: Skin is warm and dry.  Psychiatric:        Judgment: Judgment normal.  Vitals reviewed.    Assessment:  ANNUAL EXAM 1. Women's annual routine gynecological examination   2. Encounter for screening mammogram for malignant neoplasm of breast      Screening Plan:            1.  Cervical Screening-  Pap smear schedule reviewed with patient, due 2024  2. Breast screening- Exam annually and mammogram>40 planned   3.  Labs Ordered today  4. Counseling for contraception: Depo-Provera  [facility] - 11/12/20 0941       Pregnancy Intention Screening   Does the patient want to become pregnant in the next year? No    Does the patient's partner want to become pregnant in the next year? No    Would the patient like to discuss contraceptive options today? No      Contraception Wrap Up   Current Method Hormonal Injection               F/U  Return in about 1 year (around 11/12/2021) for Annual.  Barnett Applebaum, MD, [facility], [facility] 11/12/2020  10:03 AM

## 2020-11-12 NOTE — Patient Instructions (Signed)
PAP every three years Mammogram every year after age 40    Call [phone] to schedule at [facility] yearly (with PCP)  Thank you for choosing [facility]. As part of our ongoing efforts to improve patient experience, we would appreciate your feedback. Please fill out the short survey that you will receive by mail or MyChart. Your opinion is important to [place]! - Dr. Kenton Kingfisher

## 2020-11-13 LAB — TSH: TSH: 1.25 u[IU]/mL (ref 0.450–4.500)

## 2020-11-13 LAB — LIPID PANEL
Chol/HDL Ratio: 5 ratio — ABNORMAL HIGH (ref 0.0–4.4)
Cholesterol, Total: 144 mg/dL (ref 100–199)
HDL: 29 mg/dL — ABNORMAL LOW (ref >39)
LDL Chol Calc (NIH): 99 mg/dL (ref 0–99)
Triglycerides: 81 mg/dL (ref 0–149)
VLDL Cholesterol Cal: 16 mg/dL (ref 5–40)

## 2020-11-13 LAB — VITAMIN D 25 HYDROXY (VIT D DEFICIENCY, FRACTURES): Vit D, 25-Hydroxy: 31.6 ng/mL (ref 30.0–100.0)

## 2020-11-13 LAB — GLUCOSE, FASTING: Glucose, Plasma: 79 mg/dL (ref 65–99)

## 2020-12-02 ENCOUNTER — Other Ambulatory Visit: Payer: Self-pay

## 2020-12-02 ENCOUNTER — Ambulatory Visit
Admission: RE | Admit: 2020-12-02 | Discharge: 2020-12-02 | Disposition: A | Discharge status: Home / Self Care | Payer: 59 | Source: Ambulatory Visit | Attending: Obstetrics & Gynecology | Admitting: Obstetrics & Gynecology

## 2020-12-02 DIAGNOSIS — Z1231 Encounter for screening mammogram for malignant neoplasm of breast: Secondary | ICD-10-CM | POA: Diagnosis not present

## 2020-12-20 ENCOUNTER — Other Ambulatory Visit: Payer: Self-pay

## 2020-12-21 ENCOUNTER — Other Ambulatory Visit: Payer: Self-pay

## 2020-12-21 ENCOUNTER — Ambulatory Visit (INDEPENDENT_AMBULATORY_CARE_PROVIDER_SITE_OTHER): Payer: 59

## 2020-12-21 DIAGNOSIS — Z3042 Encounter for surveillance of injectable contraceptive: Secondary | ICD-10-CM

## 2020-12-21 MED ORDER — MEDROXYPROGESTERONE ACETATE 150 MG/ML IM SUSP
150.0000 mg | Freq: Once | INTRAMUSCULAR | Status: AC
  Administered 2020-12-21: 150 mg via INTRAMUSCULAR

## 2020-12-21 NOTE — Progress Notes (Signed)
Pt here for depo which was given IM right glut.  NDC# [id number]

## 2021-01-18 ENCOUNTER — Encounter: Payer: Self-pay | Admitting: Family Medicine

## 2021-01-18 ENCOUNTER — Other Ambulatory Visit: Payer: Self-pay

## 2021-01-18 ENCOUNTER — Ambulatory Visit: Payer: 59 | Admitting: Family Medicine

## 2021-01-18 VITALS — BP 128/74 | HR 77 | Temp 98.5°F | Ht 69.5 in | Wt 185.8 lb

## 2021-01-18 DIAGNOSIS — E663 Overweight: Secondary | ICD-10-CM | POA: Insufficient documentation

## 2021-01-18 DIAGNOSIS — F17200 Nicotine dependence, unspecified, uncomplicated: Secondary | ICD-10-CM | POA: Diagnosis not present

## 2021-01-18 DIAGNOSIS — F419 Anxiety disorder, unspecified: Secondary | ICD-10-CM | POA: Diagnosis not present

## 2021-01-18 DIAGNOSIS — Z23 Encounter for immunization: Secondary | ICD-10-CM

## 2021-01-18 MED ORDER — BUPROPION HCL ER (SR) 150 MG PO TB12
150.0000 mg | ORAL_TABLET | Freq: Every day | ORAL | 1 refills | Status: DC
  Filled 2021-01-18: qty 90, 90d supply, fill #0

## 2021-01-18 NOTE — Assessment & Plan Note (Signed)
Chronic and well-controlled Continue low-dose Wellbutrin Higher dose gave her drowsiness in the morning and inability to fall asleep in the evening, so we will leave her at half dose

## 2021-01-18 NOTE — Assessment & Plan Note (Signed)
Precontemplative at this time Continue to reassess

## 2021-01-18 NOTE — Progress Notes (Signed)
Established patient visit   Patient: Anita Lynch   DOB: 08-01-1980   40 y.o. Female  MRN: [id number] Visit Date: 01/18/2021  Today's healthcare provider: Lavon Paganini, MD   Chief Complaint  Patient presents with   Follow-up   Anxiety   Subjective    HPI  -Patient visited ENT and issue resolved (tonsil stone and pharyngitis) - they removed tonsil stone  -Anxiety seems to be relived per patient - doing well on 1/2 dose of Wellbutrin  -Would like to receive flu vaccine  <1/2 ppd - not in a place to quit at this time  Anxiety, Follow-up  She was last seen for anxiety 6 months ago. Changes made at last visit include none.   She reports fair compliance with treatment. (Pt reports only taking (1/2 tab daily) She reports fair tolerance of treatment. She is not having side effects.   She feels her anxiety is mild and Improved since last visit.  Symptoms: No chest pain No difficulty concentrating  No dizziness No fatigue  No feelings of losing control No insomnia  No irritable No palpitations  No panic attacks No racing thoughts  No shortness of breath No sweating  No tremors/shakes    GAD-7 Results No flowsheet data found.  PHQ-9 Scores PHQ9 SCORE ONLY 01/18/2021 07/19/2020 05/02/2017  PHQ-9 Total Score 2 7 1    ---------------------------------------------------------------------------------------------------    Medications: Outpatient Medications Prior to Visit  Medication Sig   medroxyPROGESTERone Acetate 150 MG/ML SUSY INJECT 1 MILLILITER (ML) INTO THE MUSCLE EVERY 3 MONTHS   [DISCONTINUED] buPROPion (WELLBUTRIN SR) 150 MG 12 hr tablet TAKE 1 TABLET BY MOUTH DAILY. (Patient taking differently: Take by mouth daily. Patient taking 1/2 tablet daily)   No facility-administered medications prior to visit.    Review of Systems - per HPI  Last CBC Lab Results  Component Value Date   WBC 5.0 08/20/2017   HGB 13.7 08/20/2017   HCT 41.9 08/20/2017    MCV 89 08/20/2017   MCH 29.0 08/20/2017   RDW 13.6 08/20/2017   PLT 183.0 47/42/5956   Last metabolic panel Lab Results  Component Value Date   GLUCOSE 83 10/04/2009   NA 143 10/04/2009   K 4.1 10/04/2009   CL 107 10/04/2009   CO2 30 10/04/2009   BUN 9 10/04/2009   CREATININE 0.7 10/04/2009   GFRNONAA 131.68 10/04/2009   CALCIUM 9.4 10/04/2009   PROT 7.3 10/04/2009   ALBUMIN 4.0 10/04/2009   BILITOT 0.2 (L) 10/04/2009   ALKPHOS 60 10/04/2009   AST 16 10/04/2009   ALT 17 10/04/2009   Last lipids Lab Results  Component Value Date   CHOL 144 11/12/2020   HDL 29 (L) 11/12/2020   LDLCALC 99 11/12/2020   TRIG 81 11/12/2020   CHOLHDL 5.0 (H) 11/12/2020   Last hemoglobin A1c Lab Results  Component Value Date   HGBA1C 5.5 08/14/2016   Last thyroid functions Lab Results  Component Value Date   TSH 1.250 11/12/2020   Last vitamin D Lab Results  Component Value Date   VD25OH 31.6 11/12/2020       Objective    BP 128/74 (BP Location: Right Arm, Patient Position: Sitting, Cuff Size: Normal)   Pulse 77   Temp 98.5 F (36.9 C) (Oral)   Ht 5' 9.5" (1.765 m)   Wt 185 lb 12.8 oz (84.3 kg)   SpO2 100%   BMI 27.04 kg/m  BP Readings from Last 3 Encounters:  01/18/21 128/74  11/12/20 120/80  07/19/20 127/79   Wt Readings from Last 3 Encounters:  01/18/21 185 lb 12.8 oz (84.3 kg)  11/12/20 185 lb (83.9 kg)  07/19/20 202 lb (91.6 kg)      Physical Exam Vitals reviewed.  Constitutional:      General: She is not in acute distress.    Appearance: Normal appearance. She is well-developed. She is not diaphoretic.  HENT:     Head: Normocephalic and atraumatic.  Eyes:     General: No scleral icterus.    Conjunctiva/sclera: Conjunctivae normal.  Neck:     Thyroid: No thyromegaly.  Cardiovascular:     Rate and Rhythm: Normal rate and regular rhythm.     Pulses: Normal pulses.     Heart sounds: Normal heart sounds. No murmur heard. Pulmonary:     Effort:  Pulmonary effort is normal. No respiratory distress.     Breath sounds: Normal breath sounds. No wheezing, rhonchi or rales.  Musculoskeletal:     Cervical back: Neck supple.     Right lower leg: No edema.     Left lower leg: No edema.  Lymphadenopathy:     Cervical: No cervical adenopathy.  Skin:    General: Skin is warm and dry.     Findings: No rash.  Neurological:     Mental Status: She is alert and oriented to person, place, and time. Mental status is at baseline.  Psychiatric:        Mood and Affect: Mood normal.        Behavior: Behavior normal.      No results found for any visits on 01/18/21.  Assessment & Plan     Problem List Items Addressed This Visit       Other   TOBACCO ABUSE    Precontemplative at this time Continue to reassess      Anxiety - Primary    Chronic and well-controlled Continue low-dose Wellbutrin Higher dose gave her drowsiness in the morning and inability to fall asleep in the evening, so we will leave her at half dose      Relevant Medications   buPROPion (WELLBUTRIN SR) 150 MG 12 hr tablet   Overweight    Discussed importance of healthy weight management Discussed diet and exercise         Return in about 6 months (around 07/18/2021) for CPE, With new PCP.      I, Lavon Paganini, MD, have reviewed all documentation for this visit. The documentation on 01/18/21 for the exam, diagnosis, procedures, and orders are all accurate and complete.   Sun Kihn, Dionne Bucy, MD, MPH [facility]

## 2021-01-18 NOTE — Assessment & Plan Note (Signed)
Discussed importance of healthy weight management Discussed diet and exercise  

## 2021-01-28 ENCOUNTER — Other Ambulatory Visit: Payer: Self-pay

## 2021-03-14 ENCOUNTER — Other Ambulatory Visit: Payer: Self-pay

## 2021-03-15 ENCOUNTER — Ambulatory Visit (INDEPENDENT_AMBULATORY_CARE_PROVIDER_SITE_OTHER): Payer: 59

## 2021-03-15 ENCOUNTER — Other Ambulatory Visit: Payer: Self-pay

## 2021-03-15 DIAGNOSIS — Z3042 Encounter for surveillance of injectable contraceptive: Secondary | ICD-10-CM | POA: Diagnosis not present

## 2021-03-15 MED ORDER — MEDROXYPROGESTERONE ACETATE 150 MG/ML IM SUSP
150.0000 mg | Freq: Once | INTRAMUSCULAR | Status: AC
  Administered 2021-03-15: 150 mg via INTRAMUSCULAR

## 2021-03-15 NOTE — Progress Notes (Signed)
Pt here for depo which was given IM right glut.  Pt tolerated well.  NDC# 7543-6067-70

## 2021-04-13 ENCOUNTER — Other Ambulatory Visit: Payer: Self-pay

## 2021-04-13 MED ORDER — CARESTART COVID-19 HOME TEST VI KIT
PACK | 0 refills | Status: DC
  Filled 2021-04-13: qty 2, 4d supply, fill #0

## 2021-06-06 ENCOUNTER — Other Ambulatory Visit: Payer: Self-pay

## 2021-06-07 ENCOUNTER — Ambulatory Visit (INDEPENDENT_AMBULATORY_CARE_PROVIDER_SITE_OTHER): Payer: 59

## 2021-06-07 ENCOUNTER — Other Ambulatory Visit: Payer: Self-pay

## 2021-06-07 DIAGNOSIS — Z3042 Encounter for surveillance of injectable contraceptive: Secondary | ICD-10-CM

## 2021-06-07 MED ORDER — MEDROXYPROGESTERONE ACETATE 150 MG/ML IM SUSP
150.0000 mg | Freq: Once | INTRAMUSCULAR | Status: AC
  Administered 2021-06-07: 150 mg via INTRAMUSCULAR

## 2021-07-18 ENCOUNTER — Ambulatory Visit: Payer: 59 | Admitting: Family Medicine

## 2021-07-20 ENCOUNTER — Ambulatory Visit: Payer: 59 | Admitting: Family Medicine

## 2021-07-25 ENCOUNTER — Other Ambulatory Visit: Payer: Self-pay

## 2021-07-25 ENCOUNTER — Encounter: Payer: Self-pay | Admitting: Family Medicine

## 2021-07-25 ENCOUNTER — Ambulatory Visit: Payer: 59 | Admitting: Family Medicine

## 2021-07-25 VITALS — BP 126/82 | HR 74 | Temp 98.6°F | Resp 16 | Ht 70.0 in | Wt 190.6 lb

## 2021-07-25 DIAGNOSIS — F17200 Nicotine dependence, unspecified, uncomplicated: Secondary | ICD-10-CM | POA: Diagnosis not present

## 2021-07-25 DIAGNOSIS — F419 Anxiety disorder, unspecified: Secondary | ICD-10-CM

## 2021-07-25 MED ORDER — BUPROPION HCL ER (SR) 150 MG PO TB12
150.0000 mg | ORAL_TABLET | Freq: Every day | ORAL | 1 refills | Status: DC
  Filled 2021-07-25: qty 90, 90d supply, fill #0

## 2021-07-25 NOTE — Progress Notes (Signed)
?  ?I,Joseline E Rosas,acting as a scribe for Gwyneth Sprout, FNP.,have documented all relevant documentation on the behalf of Gwyneth Sprout, FNP,as directed by  Gwyneth Sprout, FNP while in the presence of Gwyneth Sprout, FNP.  ? ?Established patient visit ? ? ?Patient: Anita Lynch   DOB: 24-Nov-1980   41 y.o. Female  MRN: [id number] ?Visit Date: 07/25/2021 ? ?Today's healthcare provider: Gwyneth Sprout, FNP  ?Introduced to Designer, jewellery role and practice setting.  All questions answered.  Discussed provider/patient relationship and expectations. ? ? ?Chief Complaint  ?Patient presents with  ? Follow-up  ? ?Subjective  ?  ?HPI  ?Anxiety, Follow-up ? ?She was last seen for anxiety 6 months ago. ?Changes made at last visit include Continue low dose Wellbutrin. ?  ?She reports excellent compliance with treatment. ?She reports excellent tolerance of treatment. ?She is not having side effects.  ? ?She feels her anxiety is moderate and Improved since last visit. ? ?Symptoms: ?No chest pain No difficulty concentrating  ?No dizziness No fatigue  ?No feelings of losing control No insomnia  ?No irritable No palpitations  ?No panic attacks No racing thoughts  ?No shortness of breath No sweating  ?No tremors/shakes   ? ?GAD-7 Results ? ?  07/25/2021  ?  8:53 AM  ?GAD-7 Generalized Anxiety Disorder Screening Tool  ?1. Feeling Nervous, Anxious, or on Edge 1  ?2. Not Being Able to Stop or Control Worrying 0  ?3. Worrying Too Much About Different Things 0  ?4. Trouble Relaxing 0  ?5. Being So Restless it's Hard To Sit Still 0  ?6. Becoming Easily Annoyed or Irritable 0  ?7. Feeling Afraid As If Something Awful Might Happen 0  ?Total GAD-7 Score 1  ?Difficulty At Work, Home, or Getting  Along With Others? Not difficult at all  ? ? ?PHQ-9 Scores ? ?  07/25/2021  ?  8:39 AM 01/18/2021  ? 10:41 AM 07/19/2020  ? 10:45 AM  ?PHQ9 SCORE ONLY  ?PHQ-9 Total Score '1 2 7   ' ? ? ?---------------------------------------------------------------------------------------------------  ? ?Medications: ?Outpatient Medications Prior to Visit  ?Medication Sig  ? COVID-19 At Home Antigen Test [facility] COVID-19 HOME TEST) KIT test as directed  ? medroxyPROGESTERone Acetate 150 MG/ML SUSY INJECT 1 MILLILITER (ML) INTO THE MUSCLE EVERY 3 MONTHS  ? [DISCONTINUED] buPROPion (WELLBUTRIN SR) 150 MG 12 hr tablet Take 1 tablet (150 mg total) by mouth daily. Patient taking 1/2 tablet daily  ? ?No facility-administered medications prior to visit.  ? ? ?Review of Systems ? ? ?  Objective  ?  ?BP 126/82 (BP Location: Left Arm, Patient Position: Sitting, Cuff Size: Normal)   Pulse 74   Temp 98.6 ?F (37 ?C) (Oral)   Resp 16   Ht '5\' 10"'  (1.778 m)   Wt 190 lb 9.6 oz (86.5 kg)   BMI 27.35 kg/m?  ? ? ?Physical Exam ?Vitals and nursing note reviewed.  ?Constitutional:   ?   General: She is not in acute distress. ?   Appearance: Normal appearance. She is overweight. She is not ill-appearing, toxic-appearing or diaphoretic.  ?HENT:  ?   Head: Normocephalic and atraumatic.  ?Cardiovascular:  ?   Rate and Rhythm: Normal rate and regular rhythm.  ?   Pulses: Normal pulses.  ?   Heart sounds: Normal heart sounds. No murmur heard. ?  No friction rub. No gallop.  ?Pulmonary:  ?   Effort: Pulmonary effort is normal. No respiratory distress.  ?  Breath sounds: Normal breath sounds. No stridor. No wheezing, rhonchi or rales.  ?Chest:  ?   Chest wall: No tenderness.  ?Abdominal:  ?   General: Bowel sounds are normal.  ?   Palpations: Abdomen is soft.  ?Musculoskeletal:     ?   General: No swelling, tenderness, deformity or signs of injury. Normal range of motion.  ?   Right lower leg: No edema.  ?   Left lower leg: No edema.  ?Skin: ?   General: Skin is warm and dry.  ?   Capillary Refill: Capillary refill takes less than 2 seconds.  ?   Coloration: Skin is not jaundiced or pale.  ?   Findings: No bruising, erythema,  lesion or rash.  ?Neurological:  ?   General: No focal deficit present.  ?   Mental Status: She is alert and oriented to person, place, and time. Mental status is at baseline.  ?   Cranial Nerves: No cranial nerve deficit.  ?   Sensory: No sensory deficit.  ?   Motor: No weakness.  ?   Coordination: Coordination normal.  ?Psychiatric:     ?   Mood and Affect: Mood normal.     ?   Behavior: Behavior normal.     ?   Thought Content: Thought content normal.     ?   Judgment: Judgment normal.  ?  ? ?No results found for any visits on 07/25/21. ? Assessment & Plan  ?  ? ?Problem List Items Addressed This Visit   ? ?  ? Other  ? Anxiety - Primary  ?  Chronic, stable ?Continue 1/2 tablet Wellbutrin 150 SR (75 mg) per patient request d/t drowsiness  ?GAD and PHQ reviewed; patient denies SI or HI ?Due for CPE; appt scheduled ?Denies further assistance with referral to psychiatry or counseling at this time ?  ?  ? Relevant Medications  ? buPROPion (WELLBUTRIN SR) 150 MG 12 hr tablet  ? TOBACCO ABUSE  ?  Patient continues to smoke cigarettes, >5 cigarettes/day ?Remains pre-contemplative regarding quitting ?Recommend use of nicotine lozenge and nicotine gum to assist ?Made aware of 1800QUITNOW ?  ?  ? ? ? ?Return in about 3 months (around 10/24/2021) for annual examination.  ?   ? ?I, Gwyneth Sprout, FNP, have reviewed all documentation for this visit. The documentation on 07/25/21 for the exam, diagnosis, procedures, and orders are all accurate and complete. ? ? ? ?Gwyneth Sprout, FNP  ?[place] ?[phone] (phone) ?[phone] (fax) ? ?[facility]  ?

## 2021-07-25 NOTE — Assessment & Plan Note (Addendum)
Chronic, stable ?Continue 1/2 tablet Wellbutrin 150 SR (75 mg) per patient request d/t drowsiness  ?GAD and PHQ reviewed; patient denies SI or HI ?Due for CPE; appt scheduled ?Denies further assistance with referral to psychiatry or counseling at this time ?

## 2021-07-25 NOTE — Assessment & Plan Note (Signed)
Patient continues to smoke cigarettes, >5 cigarettes/day ?Remains pre-contemplative regarding quitting ?Recommend use of nicotine lozenge and nicotine gum to assist ?Made aware of 1800QUITNOW ?

## 2021-08-08 ENCOUNTER — Other Ambulatory Visit: Payer: Self-pay

## 2021-08-19 ENCOUNTER — Other Ambulatory Visit: Payer: Self-pay

## 2021-08-22 ENCOUNTER — Ambulatory Visit (INDEPENDENT_AMBULATORY_CARE_PROVIDER_SITE_OTHER): Payer: 59

## 2021-08-22 DIAGNOSIS — Z30019 Encounter for initial prescription of contraceptives, unspecified: Secondary | ICD-10-CM

## 2021-08-22 DIAGNOSIS — Z3042 Encounter for surveillance of injectable contraceptive: Secondary | ICD-10-CM | POA: Diagnosis not present

## 2021-08-22 MED ORDER — MEDROXYPROGESTERONE ACETATE 150 MG/ML IM SUSP
150.0000 mg | Freq: Once | INTRAMUSCULAR | Status: AC
  Administered 2021-08-22: 150 mg via INTRAMUSCULAR

## 2021-08-23 ENCOUNTER — Other Ambulatory Visit: Payer: Self-pay

## 2021-09-18 IMAGING — MG MM DIGITAL SCREENING BILAT W/ TOMO AND CAD
8 series · 8 of 24 positions shown · non-contrast
Comparison: None.

CLINICAL DATA: Screening.

EXAM:
DIGITAL SCREENING BILATERAL MAMMOGRAM WITH TOMOSYNTHESIS AND CAD
TECHNIQUE: Bilateral screening digital craniocaudal and mediolateral oblique
mammograms were obtained. Bilateral screening digital breast
tomosynthesis was performed. The images were evaluated with
computer-aided detection.

[R CC synth-2D]
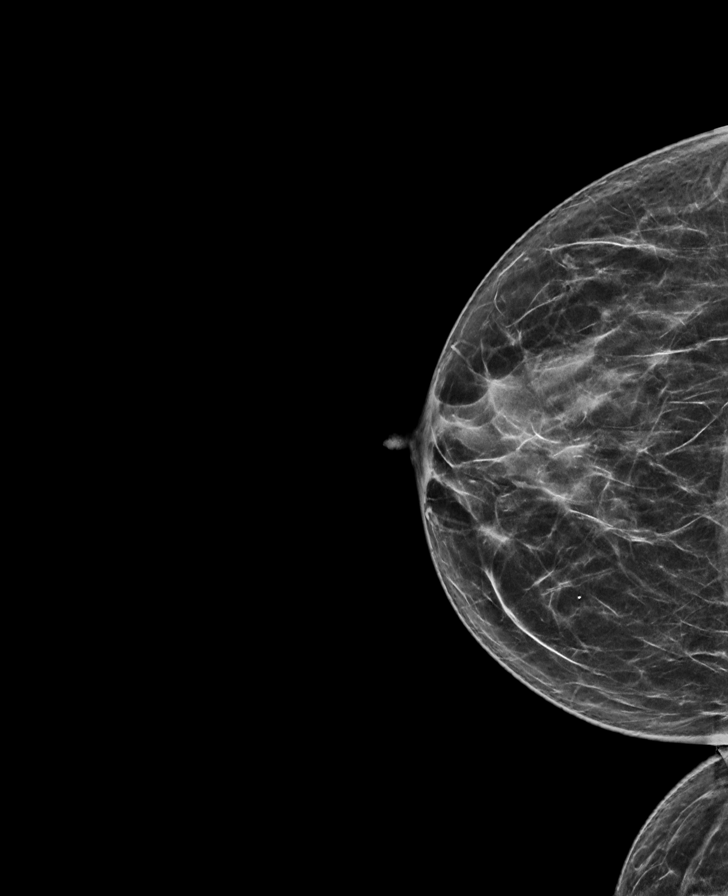

[R MLO synth-2D]
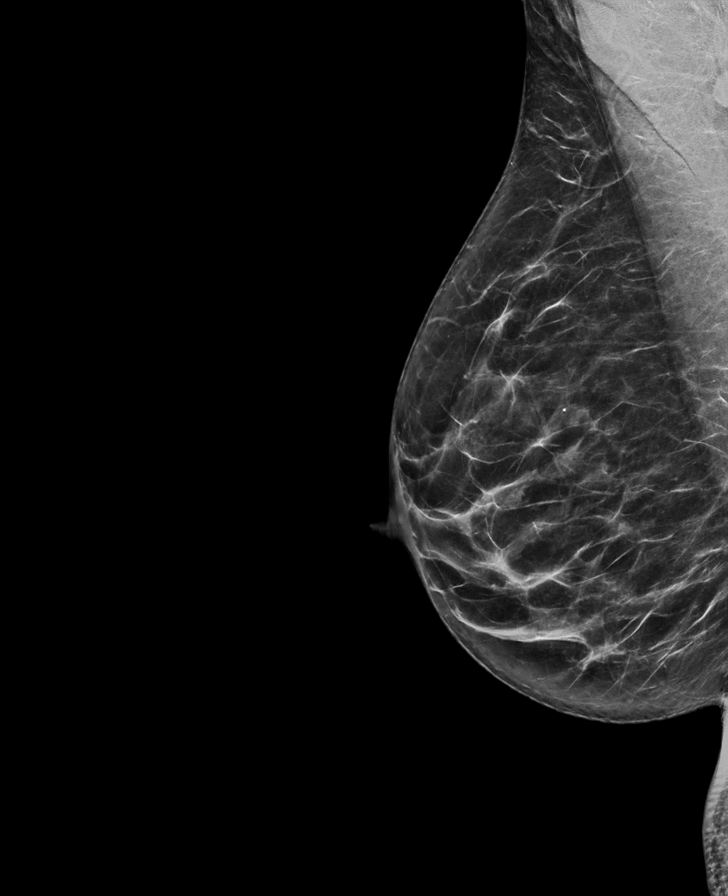

[L MLO synth-2D]
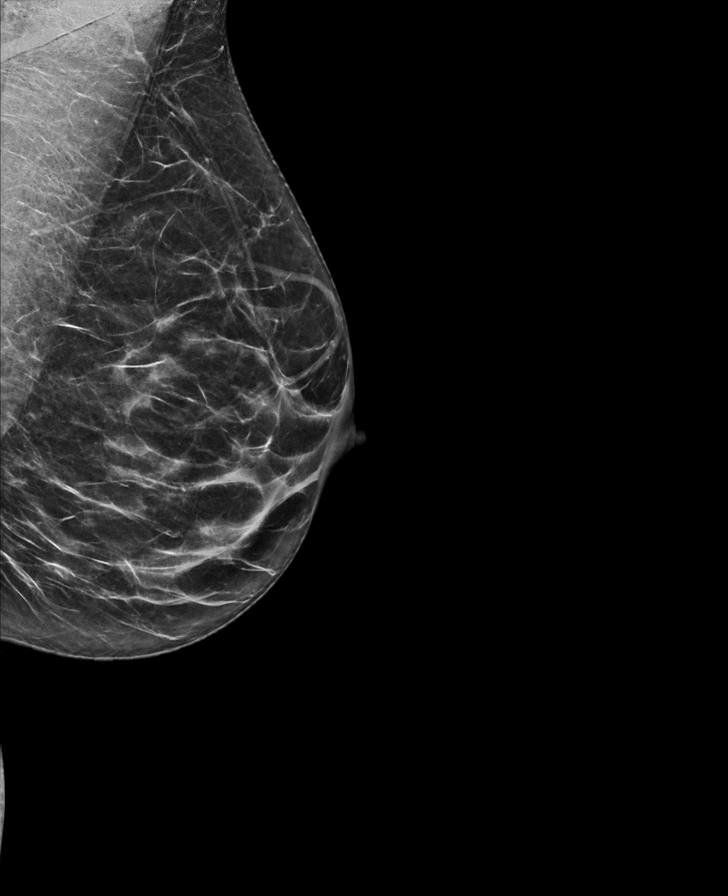

[L CC synth-2D]
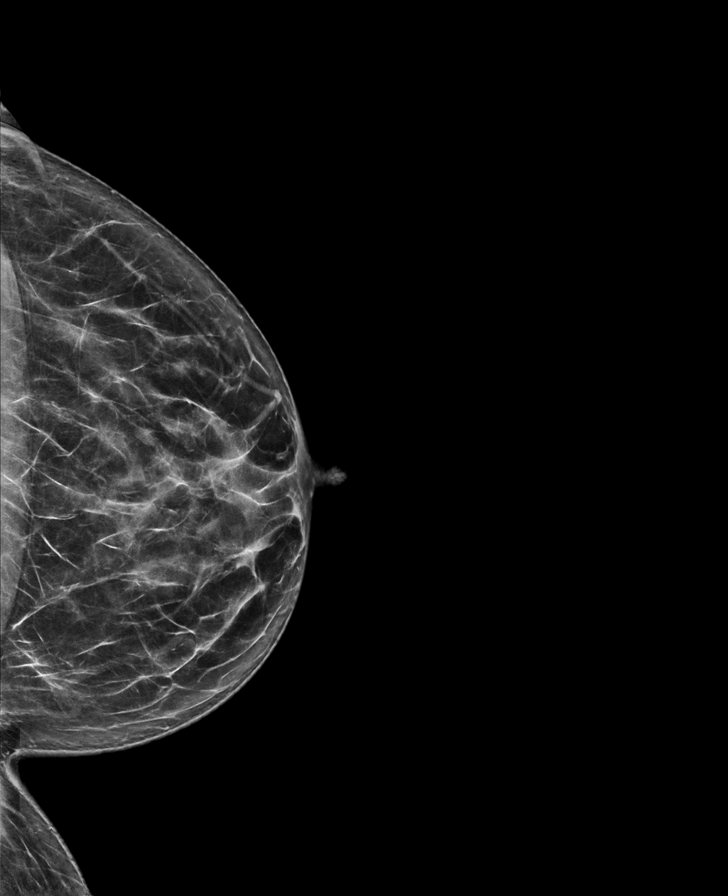

[R MLO tomo · tomo slice 36/71.0]
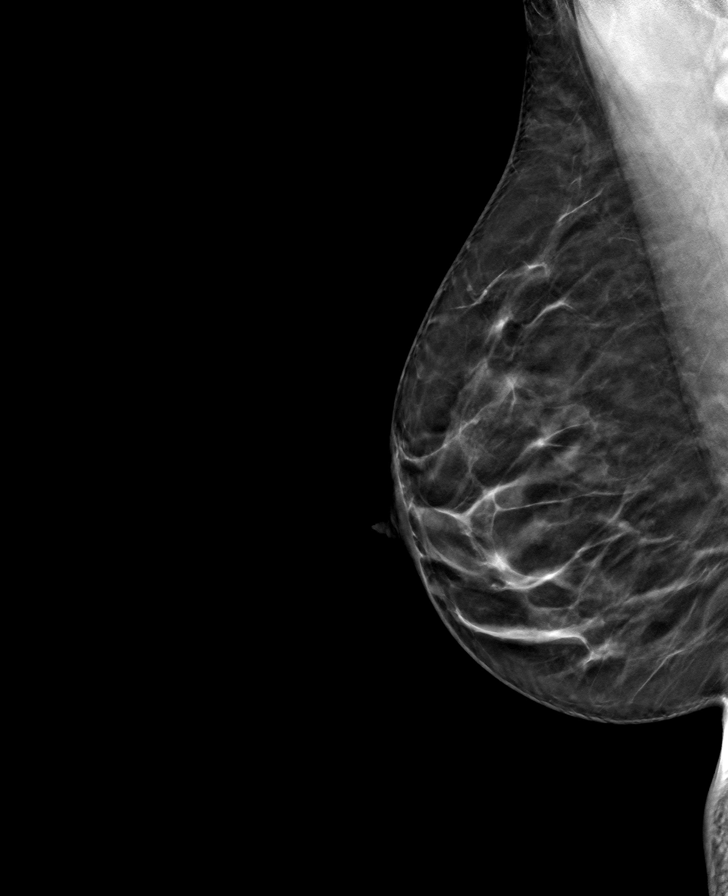

[L MLO tomo · tomo slice 34/67.0]
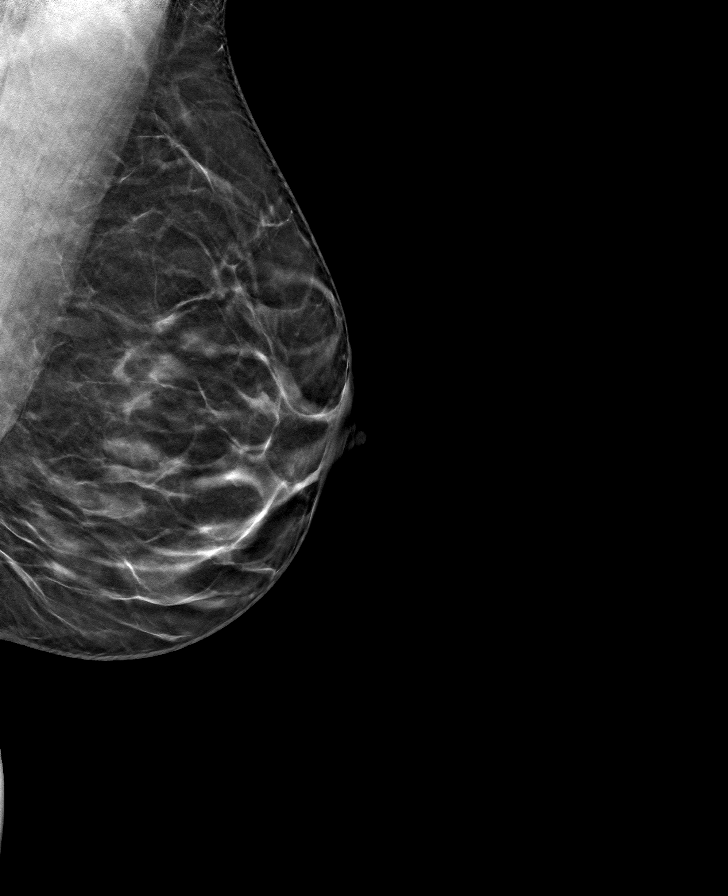

[L CC tomo · tomo slice 35/69.0]
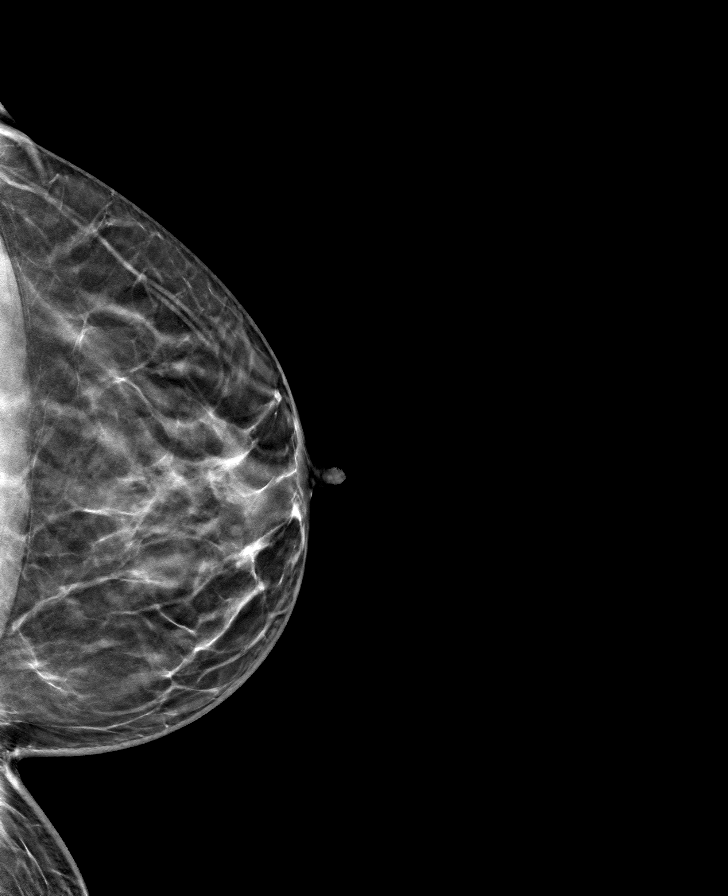

[R CC tomo · tomo slice 35/68.0]
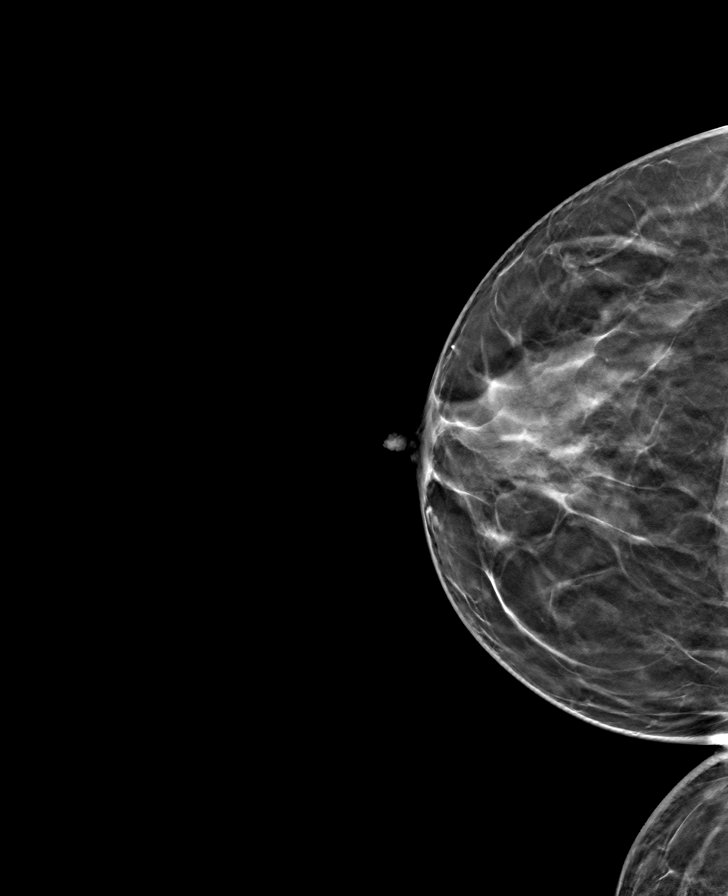

[8 of 24 positions shown; findings below may reference images not displayed]

ACR Breast Density Category b: There are scattered areas of
fibroglandular density.
FINDINGS: There are no findings suspicious for malignancy.
IMPRESSION: No mammographic evidence of malignancy. A result letter of this
screening mammogram will be mailed directly to the patient.

RECOMMENDATION:
Screening mammogram in one year. (Code:XG-X-X7B)

BI-RADS CATEGORY  1: Negative.

## 2021-10-20 NOTE — Progress Notes (Signed)
Complete physical exam   Patient: Anita Lynch   DOB: 1981-03-08   41 y.o. Female  MRN: [id number] Visit Date: 10/26/2021  Today's healthcare provider: Gwyneth Sprout, FNP  Introduced to nurse practitioner role and practice setting.  All questions answered.  Discussed provider/patient relationship and expectations.   I,Tiffany J Bragg,acting as a scribe for Gwyneth Sprout, FNP.,have documented all relevant documentation on the behalf of Gwyneth Sprout, FNP,as directed by  Gwyneth Sprout, FNP while in the presence of Gwyneth Sprout, FNP.'  Chief Complaint  Patient presents with   Annual Exam   Anxiety   Subjective    Anita Lynch is a 41 y.o. female who presents today for a complete physical exam.  She reports consuming a general diet.  Does not exercise.  She generally feels well. She reports sleeping poorly. She does not have additional problems to discuss today.   HPI    Past Medical History:  Diagnosis Date   Allergy    Anemia    Anxiety    Family history of ovarian cancer    7/21 cancer genetic testing letter sent   Past Surgical History:  Procedure Laterality Date   CESAREAN SECTION  2002,2012   Social History   Socioeconomic History   Marital status: Single    Spouse name: Not on file   Number of children: Not on file   Years of education: Not on file   Highest education level: Not on file  Occupational History   Not on file  Tobacco Use   Smoking status: Every Day    Types: Cigarettes   Smokeless tobacco: Never   Tobacco comments:    5 cigs/day; encourage use of lozenge/gum  Vaping Use   Vaping Use: Never used  Substance and Sexual Activity   Alcohol use: No    Alcohol/week: 0.0 standard drinks of alcohol   Drug use: No   Sexual activity: Yes    Birth control/protection: Surgical, Injection  Other Topics Concern   Not on file  Social History Narrative   Not on file   Social Determinants of Health   Financial Resource Strain:  Not on file  Food Insecurity: Not on file  Transportation Needs: Not on file  Physical Activity: Not on file  Stress: Not on file  Social Connections: Not on file  Intimate Partner Violence: Not on file   Family Status  Relation Name Status   Mother  Alive   Father  Alive   Sister  Alive   Brother  Alive   Daughter  Alive   Son  Alive   MGM  Deceased   MGF  Other   PGM  Deceased   PGF  Deceased   Field seismologist  (Not Specified)   Programmer, systems  (Not Specified)   Family History  Problem Relation Age of Onset   Hypertension Mother    Hyperlipidemia Father    Hypertension Father    Lupus Sister    Breast cancer Maternal Grandmother        ? age   Diabetes Paternal Grandmother    Dementia Paternal Grandfather    Alzheimer's disease Paternal Grandfather    Ovarian cancer Paternal Aunt    Colon cancer Maternal Aunt    Allergies  Allergen Reactions   Bee Venom Swelling    Bees Bees   Shellfish Allergy Swelling    Patient Care Team: Gwyneth Sprout, FNP as PCP - General (Family  Medicine)   Medications: Outpatient Medications Prior to Visit  Medication Sig   medroxyPROGESTERone Acetate 150 MG/ML SUSY INJECT 1 MILLILITER (ML) INTO THE MUSCLE EVERY 3 MONTHS   [DISCONTINUED] buPROPion (WELLBUTRIN SR) 150 MG 12 hr tablet Take 1 tablet (150 mg total) by mouth daily. Patient taking 1/2 tablet daily   [DISCONTINUED] COVID-19 At Home Antigen Test ([facility] COVID-19 HOME TEST) KIT test as directed   No facility-administered medications prior to visit.    Review of Systems  Constitutional:  Positive for unexpected weight change.  Musculoskeletal:  Positive for neck stiffness.      Objective     BP 128/85 (BP Location: Left Arm, Patient Position: Sitting, Cuff Size: Normal)   Pulse 74   Temp 97.8 F (36.6 C) (Oral)   Resp 16   Ht _0  (1.778 m)   Wt 189 lb 4.8 oz (85.9 kg)   SpO2 99%   BMI 27.16 kg/m     Physical Exam Vitals and nursing note reviewed.   Constitutional:      General: She is awake. She is not in acute distress.    Appearance: Normal appearance. She is well-developed, well-groomed and overweight. She is not ill-appearing, toxic-appearing or diaphoretic.  HENT:     Head: Normocephalic and atraumatic.     Jaw: There is normal jaw occlusion. No trismus, tenderness, swelling or pain on movement.     Right Ear: Hearing, tympanic membrane, ear canal and external ear normal. There is no impacted cerumen.     Left Ear: Hearing, tympanic membrane, ear canal and external ear normal. There is no impacted cerumen.     Nose: Nose normal. No congestion or rhinorrhea.     Right Turbinates: Not enlarged, swollen or pale.     Left Turbinates: Not enlarged, swollen or pale.     Right Sinus: No maxillary sinus tenderness or frontal sinus tenderness.     Left Sinus: No maxillary sinus tenderness or frontal sinus tenderness.     Mouth/Throat:     Lips: Pink.     Mouth: Mucous membranes are moist. No injury.     Tongue: No lesions.     Pharynx: Oropharynx is clear. Uvula midline. No pharyngeal swelling, oropharyngeal exudate, posterior oropharyngeal erythema or uvula swelling.     Tonsils: No tonsillar exudate or tonsillar abscesses.  Eyes:     General: Lids are normal. Lids are everted, no foreign bodies appreciated. Vision grossly intact. Gaze aligned appropriately. No allergic shiner or visual field deficit.       Right eye: No discharge.        Left eye: No discharge.     Extraocular Movements: Extraocular movements intact.     Conjunctiva/sclera: Conjunctivae normal.     Right eye: Right conjunctiva is not injected. No exudate.    Left eye: Left conjunctiva is not injected. No exudate.    Pupils: Pupils are equal, round, and reactive to light.  Neck:     Thyroid: Thyromegaly present. No thyroid mass or thyroid tenderness.     Vascular: No carotid bruit.     Trachea: Trachea normal.      Comments: Patient complaint of fullness in R  upper thyroid, indicated by arrow; prefer to do TSH and Free T4 today and then if abnormal, f/u with thyroid US Cardiovascular:     Rate and Rhythm: Normal rate and regular rhythm.     Pulses: Normal pulses.          Carotid pulses are  2+ on the right side and 2+ on the left side.      Radial pulses are 2+ on the right side and 2+ on the left side.       Dorsalis pedis pulses are 2+ on the right side and 2+ on the left side.       Posterior tibial pulses are 2+ on the right side and 2+ on the left side.     Heart sounds: Normal heart sounds, S1 normal and S2 normal. No murmur heard.    No friction rub. No gallop.  Pulmonary:     Effort: Pulmonary effort is normal. No respiratory distress.     Breath sounds: Normal breath sounds and air entry. No stridor. No wheezing, rhonchi or rales.  Chest:     Chest wall: No tenderness.  Abdominal:     General: Abdomen is flat. Bowel sounds are normal. There is no distension.     Palpations: Abdomen is soft. There is no mass.     Tenderness: There is no abdominal tenderness. There is no right CVA tenderness, left CVA tenderness, guarding or rebound.     Hernia: No hernia is present.  Genitourinary:    Comments: Exam deferred; denies complaints Musculoskeletal:        General: No swelling, tenderness, deformity or signs of injury. Normal range of motion.     Cervical back: Full passive range of motion without pain, normal range of motion and neck supple. No edema, rigidity or tenderness. No muscular tenderness.     Right lower leg: No edema.     Left lower leg: No edema.  Lymphadenopathy:     Cervical: No cervical adenopathy.     Right cervical: No superficial, deep or posterior cervical adenopathy.    Left cervical: No superficial, deep or posterior cervical adenopathy.  Skin:    General: Skin is warm and dry.     Capillary Refill: Capillary refill takes less than 2 seconds.     Coloration: Skin is not jaundiced or pale.     Findings: No  bruising, erythema, lesion or rash.  Neurological:     General: No focal deficit present.     Mental Status: She is alert and oriented to person, place, and time. Mental status is at baseline.     GCS: GCS eye subscore is 4. GCS verbal subscore is 5. GCS motor subscore is 6.     Sensory: Sensation is intact. No sensory deficit.     Motor: Motor function is intact. No weakness.     Coordination: Coordination is intact. Coordination normal.     Gait: Gait is intact. Gait normal.  Psychiatric:        Attention and Perception: Attention and perception normal.        Mood and Affect: Mood and affect normal.        Speech: Speech normal.        Behavior: Behavior normal. Behavior is cooperative.        Thought Content: Thought content normal.        Cognition and Memory: Cognition and memory normal.        Judgment: Judgment normal.      Last depression screening scores    10/26/2021    8:37 AM 07/25/2021    8:39 AM 01/18/2021   10:41 AM  PHQ 2/9 Scores  PHQ - 2 Score 0 1 0  PHQ- 9 Score 4  2   Last fall risk screening  10/26/2021    8:37 AM  Fall Risk   Falls in the past year? 0  Number falls in past yr: 0  Injury with Fall? 0   Last Audit-C alcohol use screening    10/26/2021    8:38 AM  Alcohol Use Disorder Test (AUDIT)  1. How often do you have a drink containing alcohol? 0  2. How many drinks containing alcohol do you have on a typical day when you are drinking? 0  3. How often do you have six or more drinks on one occasion? 0  AUDIT-C Score 0   A score of 3 or more in women, and 4 or more in men indicates increased risk for alcohol abuse, EXCEPT if all of the points are from question 1   No results found for any visits on 10/26/21.  Assessment & Plan    Routine Health Maintenance and Physical Exam  Exercise Activities and Dietary recommendations  Goals   None     Immunization History  Administered Date(s) Administered   Hepatitis B 05/22/2005    Influenza,inj,Quad PF,6+ Mos 01/18/2021   PFIZER(Purple Top)SARS-COV-2 Vaccination 11/14/2019   Td 05/02/2017    Health Maintenance  Topic Date Due   Hepatitis C Screening  Never done   COVID-19 Vaccine (2 - Pfizer series) 01/09/2020   INFLUENZA VACCINE  11/22/2021   PAP SMEAR-Modifier  10/28/2022   TETANUS/TDAP  05/03/2027   HIV Screening  Completed   HPV VACCINES  Aged Out    Discussed health benefits of physical activity, and encouraged her to engage in regular exercise appropriate for her age and condition.  Problem List Items Addressed This Visit       Other   Annual physical exam - Primary    Due for CPE, previously seen for anxiety acute visit in 07/2021 Denies health concerns since outside of thyroid fullness and difficulty sleeping since passing of mother; sees GYN for PAPs  Things to do to keep yourself healthy  - Exercise at least 30-45 minutes a day, 3-4 days a week.  - Eat a low-fat diet with lots of fruits and vegetables, up to 7-9 servings per day.  - Seatbelts can save your life. Wear them always.  - Smoke detectors on every level of your home, check batteries every year.  - Eye Doctor - have an eye exam every 1-2 years  - Safe sex - if you may be exposed to STDs, use a condom.  - Alcohol -  If you drink, do it moderately, less than 2 drinks per day.  - [place]. Choose someone to speak for you if you are not able.  - Depression is common in our stressful world.If you're feeling down or losing interest in things you normally enjoy, please come in for a visit.  - Violence - If anyone is threatening or hurting you, please call immediately.         Relevant Orders   CBC with Differential/Platelet   Comprehensive Metabolic Panel (CMET)   Lipid panel   TSH + free T4   Anxiety    Chronic, stable Wishes to continue 1/2 dose of SR wellbutrin (150 mg) due to previous side effects Denies SI or HI; endorses slight increase in weight due to  passing of her mom      Relevant Medications   buPROPion (WELLBUTRIN SR) 150 MG 12 hr tablet   Cigarette nicotine dependence without complication    Chronic, stable Patient is aware of  health risks Currently smoking, not vaping, 5 cigs/day Denies desire to quit at this time       Encounter for hepatitis C screening test for low risk patient    Low risk screen Treatable, and curable. If left untreated Hep C can lead to cirrhosis and liver failure. Encourage routine testing; recommend repeat testing if risk factors change.       Relevant Orders   Hepatitis C Antibody   Encounter for screening mammogram for malignant neoplasm of breast    Denies changes in breasts; does perform breast self exams Due for screening for mammogram, denies breast concerns, provided with phone number to call and schedule appointment for mammogram. Encouraged to repeat breast cancer screening every 1-2 years. Please call and schedule your mammogram:  [facility] at [facility]  [place], [place],  [place]  [address] Get Driving Directions Main: [phone]  Sunday:Closed Monday:7:20 AM - 5:00 PM Tuesday:7:20 AM - 5:00 PM Wednesday:7:20 AM - 5:00 PM Thursday:7:20 AM - 5:00 PM Friday:7:20 AM - 4:30 PM Saturday:Closed       Relevant Orders   MM 3D SCREEN BREAST BILATERAL   Vitamin D deficiency    Chronic, complaints of fatigue and worsening anxiety, however, patient did note a death in the family within the last month which may be worsening her psychological complaints Request for repeat testing today       Relevant Orders   Vitamin D (25 hydroxy)     Return in about 6 months (around 04/28/2022) for chonic disease management.     Vonna Kotyk, FNP, have reviewed all documentation for this visit. The documentation on 10/26/21 for the exam, diagnosis, procedures, and orders are all accurate and complete.    Gwyneth Sprout,  [place] [phone] (phone) [phone] (fax)  [place]

## 2021-10-26 ENCOUNTER — Encounter: Payer: Self-pay | Admitting: Family Medicine

## 2021-10-26 ENCOUNTER — Other Ambulatory Visit: Payer: Self-pay

## 2021-10-26 ENCOUNTER — Ambulatory Visit (INDEPENDENT_AMBULATORY_CARE_PROVIDER_SITE_OTHER): Payer: 59 | Admitting: Family Medicine

## 2021-10-26 VITALS — BP 128/85 | HR 74 | Temp 97.8°F | Resp 16 | Ht 70.0 in | Wt 189.3 lb

## 2021-10-26 DIAGNOSIS — Z0000 Encounter for general adult medical examination without abnormal findings: Secondary | ICD-10-CM | POA: Diagnosis not present

## 2021-10-26 DIAGNOSIS — F17210 Nicotine dependence, cigarettes, uncomplicated: Secondary | ICD-10-CM | POA: Diagnosis not present

## 2021-10-26 DIAGNOSIS — Z1159 Encounter for screening for other viral diseases: Secondary | ICD-10-CM | POA: Diagnosis not present

## 2021-10-26 DIAGNOSIS — E559 Vitamin D deficiency, unspecified: Secondary | ICD-10-CM | POA: Diagnosis not present

## 2021-10-26 DIAGNOSIS — F419 Anxiety disorder, unspecified: Secondary | ICD-10-CM | POA: Diagnosis not present

## 2021-10-26 DIAGNOSIS — Z1231 Encounter for screening mammogram for malignant neoplasm of breast: Secondary | ICD-10-CM | POA: Insufficient documentation

## 2021-10-26 MED ORDER — BUPROPION HCL ER (SR) 150 MG PO TB12
150.0000 mg | ORAL_TABLET | Freq: Every day | ORAL | 1 refills | Status: DC
Start: 2021-10-26 — End: 2023-04-19
  Filled 2021-10-26: qty 90, 90d supply, fill #0
  Filled 2022-01-14: qty 90, 90d supply, fill #1

## 2021-10-26 NOTE — Patient Instructions (Signed)
Due for screening for mammogram, denies breast concerns, provided with phone number to call and schedule appointment for mammogram. Encouraged to repeat breast cancer screening every 1-2 years.  Please call and schedule your mammogram:  [facility] at [facility]  [address], [address] [facility] [place],  [place]  [address] Get Driving Directions Main: [phone]  Sunday:Closed Monday:7:20 AM - 5:00 PM Tuesday:7:20 AM - 5:00 PM Wednesday:7:20 AM - 5:00 PM Thursday:7:20 AM - 5:00 PM Friday:7:20 AM - 4:30 PM Saturday:Closed  

## 2021-10-26 NOTE — Assessment & Plan Note (Signed)
Low risk screen Treatable, and curable. If left untreated Hep C can lead to cirrhosis and liver failure. Encourage routine testing; recommend repeat testing if risk factors change.  

## 2021-10-26 NOTE — Assessment & Plan Note (Signed)
Chronic, complaints of fatigue and worsening anxiety, however, patient did note a death in the family within the last month which may be worsening her psychological complaints Request for repeat testing today

## 2021-10-26 NOTE — Assessment & Plan Note (Signed)
Denies changes in breasts; does perform breast self exams Due for screening for mammogram, denies breast concerns, provided with phone number to call and schedule appointment for mammogram. Encouraged to repeat breast cancer screening every 1-2 years. Please call and schedule your mammogram:  [facility] at [facility]  [place], [place],  [place]  [address] Get Driving Directions Main: [phone]  Sunday:Closed Monday:7:20 AM - 5:00 PM Tuesday:7:20 AM - 5:00 PM Wednesday:7:20 AM - 5:00 PM Thursday:7:20 AM - 5:00 PM Friday:7:20 AM - 4:30 PM Saturday:Closed

## 2021-10-26 NOTE — Assessment & Plan Note (Addendum)
Due for CPE, previously seen for anxiety acute visit in 07/2021 Denies health concerns since outside of thyroid fullness and difficulty sleeping since passing of mother; sees GYN for PAPs  Things to do to keep yourself healthy  - Exercise at least 30-45 minutes a day, 3-4 days a week.  - Eat a low-fat diet with lots of fruits and vegetables, up to 7-9 servings per day.  - Seatbelts can save your life. Wear them always.  - Smoke detectors on every level of your home, check batteries every year.  - Eye Doctor - have an eye exam every 1-2 years  - Safe sex - if you may be exposed to STDs, use a condom.  - Alcohol -  If you drink, do it moderately, less than 2 drinks per day.  - [place]. Choose someone to speak for you if you are not able.  - Depression is common in our stressful world.If you're feeling down or losing interest in things you normally enjoy, please come in for a visit.  - Violence - If anyone is threatening or hurting you, please call immediately.

## 2021-10-26 NOTE — Assessment & Plan Note (Signed)
Chronic, stable Patient is aware of health risks Currently smoking, not vaping, 5 cigs/day Denies desire to quit at this time

## 2021-10-26 NOTE — Assessment & Plan Note (Signed)
Chronic, stable Wishes to continue 1/2 dose of SR wellbutrin (150 mg) due to previous side effects Denies SI or HI; endorses slight increase in weight due to passing of her mom

## 2021-10-27 ENCOUNTER — Other Ambulatory Visit: Payer: Self-pay

## 2021-10-27 ENCOUNTER — Other Ambulatory Visit: Payer: Self-pay | Admitting: Family Medicine

## 2021-10-27 DIAGNOSIS — E559 Vitamin D deficiency, unspecified: Secondary | ICD-10-CM

## 2021-10-27 LAB — CBC WITH DIFFERENTIAL/PLATELET
Basophils Absolute: 0.1 10*3/uL (ref 0.0–0.2)
Basos: 1 %
EOS (ABSOLUTE): 0.1 10*3/uL (ref 0.0–0.4)
Eos: 2 %
Hematocrit: 40.7 % (ref 34.0–46.6)
Hemoglobin: 13.9 g/dL (ref 11.1–15.9)
Immature Grans (Abs): 0 10*3/uL (ref 0.0–0.1)
Immature Granulocytes: 0 %
Lymphocytes Absolute: 2.3 10*3/uL (ref 0.7–3.1)
Lymphs: 32 %
MCH: 30.8 pg (ref 26.6–33.0)
MCHC: 34.2 g/dL (ref 31.5–35.7)
MCV: 90 fL (ref 79–97)
Monocytes Absolute: 0.5 10*3/uL (ref 0.1–0.9)
Monocytes: 6 %
Neutrophils Absolute: 4.3 10*3/uL (ref 1.4–7.0)
Neutrophils: 59 %
Platelets: 171 10*3/uL (ref 150–450)
RBC: 4.52 x10E6/uL (ref 3.77–5.28)
RDW: 11.9 % (ref 11.7–15.4)
WBC: 7.2 10*3/uL (ref 3.4–10.8)

## 2021-10-27 LAB — COMPREHENSIVE METABOLIC PANEL
ALT: 11 IU/L (ref 0–32)
AST: 11 IU/L (ref 0–40)
Albumin/Globulin Ratio: 1.6 (ref 1.2–2.2)
Albumin: 4.1 g/dL (ref 3.8–4.8)
Alkaline Phosphatase: 69 IU/L (ref 44–121)
BUN/Creatinine Ratio: 12 (ref 9–23)
BUN: 9 mg/dL (ref 6–24)
Bilirubin Total: 0.2 mg/dL (ref 0.0–1.2)
CO2: 20 mmol/L (ref 20–29)
Calcium: 9.5 mg/dL (ref 8.7–10.2)
Chloride: 106 mmol/L (ref 96–106)
Creatinine, Ser: 0.77 mg/dL (ref 0.57–1.00)
Globulin, Total: 2.5 g/dL (ref 1.5–4.5)
Glucose: 88 mg/dL (ref 70–99)
Potassium: 4.3 mmol/L (ref 3.5–5.2)
Sodium: 139 mmol/L (ref 134–144)
Total Protein: 6.6 g/dL (ref 6.0–8.5)
eGFR: 100 mL/min/{1.73_m2} (ref >59)

## 2021-10-27 LAB — TSH+FREE T4
Free T4: 1.06 ng/dL (ref 0.82–1.77)
TSH: 1.16 u[IU]/mL (ref 0.450–4.500)

## 2021-10-27 LAB — LIPID PANEL
Chol/HDL Ratio: 4 ratio (ref 0.0–4.4)
Cholesterol, Total: 147 mg/dL (ref 100–199)
HDL: 37 mg/dL — ABNORMAL LOW (ref >39)
LDL Chol Calc (NIH): 97 mg/dL (ref 0–99)
Triglycerides: 61 mg/dL (ref 0–149)
VLDL Cholesterol Cal: 13 mg/dL (ref 5–40)

## 2021-10-27 LAB — VITAMIN D 25 HYDROXY (VIT D DEFICIENCY, FRACTURES): Vit D, 25-Hydroxy: 26.2 ng/mL — ABNORMAL LOW (ref 30.0–100.0)

## 2021-10-27 LAB — HEPATITIS C ANTIBODY: Hep C Virus Ab: NONREACTIVE

## 2021-10-27 MED ORDER — VITAMIN D (ERGOCALCIFEROL) 1.25 MG (50000 UNIT) PO CAPS
50000.0000 [IU] | ORAL_CAPSULE | ORAL | 0 refills | Status: DC
Start: 2021-10-27 — End: 2022-04-26
  Filled 2021-10-27: qty 12, 84d supply, fill #0
  Filled 2022-01-14: qty 12, 84d supply, fill #1
  Filled 2022-04-26: qty 12, 84d supply, fill #2

## 2021-10-27 NOTE — Progress Notes (Signed)
Cholesterol is stable. Improvement seen in good/HDL. I recommend diet low in saturated fat and regular exercise - 30 min at least 5 times per week  Vit D remains stable; however, low. Continue to recommend high dose weekly supplement.  All other labs, chemistry, thyroid, cell counts, stable/normal.  Gwyneth Sprout, [address] [place], [place] [address] [phone] (phone) [phone] (fax) [place]

## 2021-11-01 ENCOUNTER — Other Ambulatory Visit: Payer: Self-pay

## 2021-11-01 ENCOUNTER — Other Ambulatory Visit: Payer: Self-pay | Admitting: Obstetrics & Gynecology

## 2021-11-01 DIAGNOSIS — G4489 Other headache syndrome: Secondary | ICD-10-CM

## 2021-11-03 ENCOUNTER — Encounter: Payer: Self-pay | Admitting: Family Medicine

## 2021-11-03 ENCOUNTER — Other Ambulatory Visit: Payer: Self-pay

## 2021-11-03 DIAGNOSIS — B9689 Other specified bacterial agents as the cause of diseases classified elsewhere: Secondary | ICD-10-CM | POA: Diagnosis not present

## 2021-11-03 DIAGNOSIS — N760 Acute vaginitis: Secondary | ICD-10-CM | POA: Diagnosis not present

## 2021-11-03 MED ORDER — MEDROXYPROGESTERONE ACETATE 150 MG/ML IM SUSY
PREFILLED_SYRINGE | INTRAMUSCULAR | 3 refills | Status: DC
  Filled 2021-11-03: qty 1, 84d supply, fill #0
  Filled 2022-01-23: qty 1, 84d supply, fill #1

## 2021-11-07 ENCOUNTER — Ambulatory Visit (INDEPENDENT_AMBULATORY_CARE_PROVIDER_SITE_OTHER): Payer: 59

## 2021-11-07 VITALS — BP 120/68 | Ht 69.0 in | Wt 191.0 lb

## 2021-11-07 DIAGNOSIS — Z3042 Encounter for surveillance of injectable contraceptive: Secondary | ICD-10-CM

## 2021-11-07 MED ORDER — MEDROXYPROGESTERONE ACETATE 150 MG/ML IM SUSP
150.0000 mg | Freq: Once | INTRAMUSCULAR | Status: AC
  Administered 2021-11-07: 150 mg via INTRAMUSCULAR

## 2021-11-07 NOTE — Progress Notes (Signed)
Date last pap: 10/28/2019. Last Depo-Provera: 08/22/2021. Side Effects if any: None. Serum HCG indicated? N/A. Depo-Provera 150 mg IM given by: Otila Kluver, LPN Site: [place] [facility]. Next appointment due 01/23/22-02/06/22.

## 2021-11-22 DIAGNOSIS — Z1371 Encounter for nonprocreative screening for genetic disease carrier status: Secondary | ICD-10-CM

## 2021-11-22 HISTORY — DX: Encounter for nonprocreative screening for genetic disease carrier status: Z13.71

## 2021-11-28 ENCOUNTER — Ambulatory Visit (INDEPENDENT_AMBULATORY_CARE_PROVIDER_SITE_OTHER): Payer: 59 | Admitting: Obstetrics & Gynecology

## 2021-11-28 ENCOUNTER — Encounter: Payer: Self-pay | Admitting: Obstetrics & Gynecology

## 2021-11-28 ENCOUNTER — Other Ambulatory Visit (HOSPITAL_COMMUNITY)
Admission: RE | Admit: 2021-11-28 | Discharge: 2021-11-28 | Disposition: A | Discharge status: Home / Self Care | Payer: 59 | Source: Ambulatory Visit | Attending: Obstetrics & Gynecology | Admitting: Obstetrics & Gynecology

## 2021-11-28 VITALS — BP 128/80 | Ht 69.0 in | Wt 188.0 lb

## 2021-11-28 DIAGNOSIS — Z8049 Family history of malignant neoplasm of other genital organs: Secondary | ICD-10-CM | POA: Diagnosis not present

## 2021-11-28 DIAGNOSIS — Z124 Encounter for screening for malignant neoplasm of cervix: Secondary | ICD-10-CM | POA: Insufficient documentation

## 2021-11-28 DIAGNOSIS — Z803 Family history of malignant neoplasm of breast: Secondary | ICD-10-CM | POA: Diagnosis not present

## 2021-11-28 DIAGNOSIS — Z01419 Encounter for gynecological examination (general) (routine) without abnormal findings: Secondary | ICD-10-CM

## 2021-11-28 DIAGNOSIS — Z800 Family history of malignant neoplasm of digestive organs: Secondary | ICD-10-CM

## 2021-11-28 NOTE — Progress Notes (Signed)
Subjective:    Anita Lynch is a 41 y.o. single P2 (44 and 6 yo kids) who presents for an annual exam. The patient has no complaints today. The patient is sexually active. GYN screening history: last pap: was normal. The patient wears seatbelts: yes. The patient participates in regular exercise: yes. Has the patient ever been transfused or tattooed?: yes. The patient reports that there is not domestic violence in her life.   Menstrual History: OB History     Gravida  2   Para  2   Term      Preterm  2   AB      Living  2      SAB      IAB      Ectopic      Multiple      Live Births              Menarche age: 31 No LMP recorded. Patient has had an injection.    The following portions of the patient's history were reviewed and updated as appropriate: allergies, current medications, past family history, past medical history, past social history, past surgical history, and problem list.  Review of Systems Pertinent items are noted in HPI.  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 She has been monogamous for years. She has used depo provera for 6 years, is amenorrheic, and is aware of the risks of decreased bone mass. She declines alternatives for contraception, even OCPs which can decrease her risk of uterine cancer.   FH: + uterine cancer in her mom (deceased), maternal great aunt had colon cancer, and maternal grandmother had breast cancer.    Objective:    BP 128/80   Ht '5\' 9"'  (1.753 m)   Wt 188 lb (85.3 kg)   BMI 27.76 kg/m   General Appearance:    Alert, cooperative, no distress, appears stated age  Head:    Normocephalic, without obvious abnormality, atraumatic  Eyes:    PERRL, conjunctiva/corneas clear, EOM's intact, fundi    benign, both eyes  Ears:    Normal TM's and external ear canals, both ears  Nose:   Nares normal, septum midline, mucosa normal, no drainage    or sinus tenderness  Throat:   Lips, mucosa, and tongue normal; teeth and  gums normal  Neck:   Supple, symmetrical, trachea midline, no adenopathy;    thyroid:  no enlargement/tenderness/nodules; no carotid   bruit or JVD  Back:     Symmetric, no curvature, ROM normal, no CVA tenderness  Lungs:     Clear to auscultation bilaterally, respirations unlabored  Chest Wall:    No tenderness or deformity   Heart:    Regular rate and rhythm, S1 and S2 normal, no murmur, rub   or gallop  Breast Exam:    No tenderness, masses, or nipple abnormality  Abdomen:     Soft, non-tender, bowel sounds active all four quadrants,    no masses, no organomegaly  Genitourinary:             External: Normal external female genitalia.  Normal urethral meatus, normal Bartholin's and Skene's glands.               Vagina: Normal vaginal mucosa, no evidence of prolapse.               Cervix: Grossly normal in appearance, no bleeding             Uterus: Non-enlarged, mobile,  retroverted, normal contour.  No CMT             Adnexa: ovaries non-enlarged, no adnexal masses             Rectal: deferred       Extremities:   Extremities normal, atraumatic, no cyanosis or edema  Pulses:   2+ and symmetric all extremities  Skin:   Skin color, texture, turgor normal, no rashes or lesions  Lymph nodes:   Cervical, supraclavicular, and axillary nodes normal  Neurologic:   CNII-XII intact, normal strength, sensation and reflexes    throughout  .    Assessment:    Healthy female exam.  Contraception FH of breast, uterine, and colon cancer on her maternal side   Plan:     Await pap smear results.  GC/CT per her request My[facility] genetic testing Continue depo provera and calcium supplementation

## 2021-11-30 LAB — CYTOLOGY - PAP
Chlamydia: NEGATIVE
Comment: NEGATIVE
Comment: NEGATIVE
Comment: NORMAL
Diagnosis: NEGATIVE
High risk HPV: NEGATIVE
Neisseria Gonorrhea: NEGATIVE

## 2021-12-05 ENCOUNTER — Ambulatory Visit
Admission: RE | Admit: 2021-12-05 | Discharge: 2021-12-05 | Disposition: A | Discharge status: Home / Self Care | Payer: 59 | Source: Ambulatory Visit | Attending: Family Medicine | Admitting: Family Medicine

## 2021-12-05 DIAGNOSIS — Z1231 Encounter for screening mammogram for malignant neoplasm of breast: Secondary | ICD-10-CM | POA: Insufficient documentation

## 2021-12-06 ENCOUNTER — Encounter: Payer: Self-pay | Admitting: Obstetrics & Gynecology

## 2021-12-06 ENCOUNTER — Other Ambulatory Visit: Payer: Self-pay | Admitting: Family Medicine

## 2021-12-06 DIAGNOSIS — R928 Other abnormal and inconclusive findings on diagnostic imaging of breast: Secondary | ICD-10-CM

## 2021-12-06 DIAGNOSIS — N63 Unspecified lump in unspecified breast: Secondary | ICD-10-CM

## 2021-12-08 ENCOUNTER — Encounter: Payer: Self-pay | Admitting: Obstetrics and Gynecology

## 2021-12-14 ENCOUNTER — Ambulatory Visit
Admission: RE | Admit: 2021-12-14 | Discharge: 2021-12-14 | Disposition: A | Discharge status: Home / Self Care | Payer: 59 | Source: Ambulatory Visit | Attending: Family Medicine | Admitting: Family Medicine

## 2021-12-14 DIAGNOSIS — N63 Unspecified lump in unspecified breast: Secondary | ICD-10-CM

## 2021-12-14 DIAGNOSIS — R928 Other abnormal and inconclusive findings on diagnostic imaging of breast: Secondary | ICD-10-CM | POA: Diagnosis not present

## 2021-12-14 DIAGNOSIS — N6002 Solitary cyst of left breast: Secondary | ICD-10-CM | POA: Diagnosis not present

## 2021-12-14 NOTE — Progress Notes (Signed)
Hi Alayziah  Normal mammogram, cysts noted in R breast are benign; repeat in 1 year.  Please let us know if you have any questions.  Thank you,  Tally Joe, FNP

## 2021-12-22 ENCOUNTER — Telehealth: Payer: Self-pay | Admitting: Obstetrics and Gynecology

## 2021-12-22 NOTE — Telephone Encounter (Signed)
Pt aware of neg MyRisk results except NTHL1 VUS. IBIS=15.2%/riskscore=16.9%. No increased screening recommendations.   Patient understands these results only apply to her and her children, and this is not indicative of genetic testing results of her other family members. It is recommended that her other family members have genetic testing done.  Pt also understands negative genetic testing doesn't mean she will never get any of these cancers.   Hard copy mailed to pt. F/u prn.

## 2022-01-15 ENCOUNTER — Other Ambulatory Visit: Payer: Self-pay

## 2022-01-16 ENCOUNTER — Other Ambulatory Visit: Payer: Self-pay

## 2022-01-23 ENCOUNTER — Other Ambulatory Visit: Payer: Self-pay

## 2022-01-26 ENCOUNTER — Ambulatory Visit (INDEPENDENT_AMBULATORY_CARE_PROVIDER_SITE_OTHER): Payer: 59

## 2022-01-26 ENCOUNTER — Ambulatory Visit: Payer: 59

## 2022-01-26 DIAGNOSIS — Z3042 Encounter for surveillance of injectable contraceptive: Secondary | ICD-10-CM

## 2022-01-26 MED ORDER — MEDROXYPROGESTERONE ACETATE 150 MG/ML IM SUSP
150.0000 mg | Freq: Once | INTRAMUSCULAR | Status: AC
  Administered 2022-01-26: 150 mg via INTRAMUSCULAR

## 2022-01-26 NOTE — Progress Notes (Cosign Needed Addendum)
Last Depo-Provera: 11/07/2021. Side Effects if any: None. Serum HCG indicated? N/A. Depo-Provera 150 mg IM given by: Otelia Limes, CMA  Site: [facility] [place].

## 2022-01-31 ENCOUNTER — Ambulatory Visit: Payer: 59

## 2022-04-20 ENCOUNTER — Ambulatory Visit (INDEPENDENT_AMBULATORY_CARE_PROVIDER_SITE_OTHER): Payer: 59

## 2022-04-20 VITALS — BP 130/90 | HR 83 | Resp 16 | Ht 69.0 in | Wt 198.9 lb

## 2022-04-20 DIAGNOSIS — Z3042 Encounter for surveillance of injectable contraceptive: Secondary | ICD-10-CM | POA: Diagnosis not present

## 2022-04-20 MED ORDER — MEDROXYPROGESTERONE ACETATE 150 MG/ML IM SUSP
150.0000 mg | Freq: Once | INTRAMUSCULAR | Status: AC
  Administered 2022-04-20: 150 mg via INTRAMUSCULAR

## 2022-04-20 NOTE — Progress Notes (Addendum)
    NURSE VISIT NOTE  Subjective:    Patient ID: NONA GRACEY, female    DOB: 1980/04/27, 41 y.o.   MRN: [id number]  HPI  Patient is a 41 y.o. G16P0202 female who presents for surveillance of depo provera injection.  Last Depo-Provera: 01/26/2022 Side Effects if any: None. Serum HCG indicated? N/A. Depo-Provera 150 mg IM given by: Cristy Folks, CMA     The following portions of the patient's history were reviewed and updated as appropriate: allergies, current medications, past family history, past medical history, past social history, past surgical history, and problem list.  Review of Systems Pertinent items are noted in HPI.   Objective:   Blood pressure (!) 130/90, pulse 83, resp. rate 16, height '5\' 9"'$  (1.753 m), weight 198 lb 14.4 oz (90.2 kg). Body mass index is 29.37 kg/m.  General appearance: alert, cooperative, and no distress    Assessment:   1. Encounter for surveillance of injectable contraceptive      Plan:   - Follow up in 3 months for next depo provera injection (March 15 -March 29)   Cristy Folks, [place]

## 2022-04-20 NOTE — Patient Instructions (Incomplete)

## 2022-04-26 ENCOUNTER — Other Ambulatory Visit: Payer: Self-pay | Admitting: Family Medicine

## 2022-04-26 ENCOUNTER — Other Ambulatory Visit: Payer: Self-pay

## 2022-04-26 DIAGNOSIS — E559 Vitamin D deficiency, unspecified: Secondary | ICD-10-CM

## 2022-04-27 ENCOUNTER — Other Ambulatory Visit: Payer: Self-pay

## 2022-04-27 MED ORDER — VITAMIN D (ERGOCALCIFEROL) 1.25 MG (50000 UNIT) PO CAPS
50000.0000 [IU] | ORAL_CAPSULE | ORAL | 0 refills | Status: DC
  Filled 2022-04-27: qty 13, 90d supply, fill #0
  Filled 2022-06-11 – 2022-07-25 (×2): qty 12, 84d supply, fill #0
  Filled 2022-10-12 – 2022-11-09 (×3): qty 12, 84d supply, fill #1
  Filled 2023-01-26 – 2023-02-03 (×2): qty 12, 84d supply, fill #2

## 2022-05-05 ENCOUNTER — Other Ambulatory Visit: Payer: Self-pay

## 2022-05-12 DIAGNOSIS — H524 Presbyopia: Secondary | ICD-10-CM | POA: Diagnosis not present

## 2022-06-11 ENCOUNTER — Other Ambulatory Visit: Payer: Self-pay

## 2022-06-12 ENCOUNTER — Other Ambulatory Visit: Payer: Self-pay

## 2022-06-27 ENCOUNTER — Other Ambulatory Visit: Payer: Self-pay

## 2022-07-20 ENCOUNTER — Ambulatory Visit (INDEPENDENT_AMBULATORY_CARE_PROVIDER_SITE_OTHER): Payer: Commercial Managed Care - PPO

## 2022-07-20 VITALS — Wt 202.5 lb

## 2022-07-20 DIAGNOSIS — Z3042 Encounter for surveillance of injectable contraceptive: Secondary | ICD-10-CM

## 2022-07-20 MED ORDER — MEDROXYPROGESTERONE ACETATE 150 MG/ML IM SUSP
150.0000 mg | Freq: Once | INTRAMUSCULAR | Status: AC
  Administered 2022-07-20: 150 mg via INTRAMUSCULAR

## 2022-07-20 NOTE — Progress Notes (Signed)
    NURSE VISIT NOTE  Subjective:    Patient ID: Anita Lynch, female    DOB: 10-Mar-1981, 42 y.o.   MRN: [id number]  HPI  Patient is a 42 y.o. G65P0202 female who presents for depo provera injection.   Objective:    Wt 202 lb 8 oz (91.9 kg)   BMI 29.90 kg/m   Last Annual: 11/28/2021. Last pap: 11/28/2021. Last Depo-Provera: 04/20/22. Side Effects if any: none. Serum HCG indicated? No . Depo-Provera 150 mg IM given by: Jennings Books, CMA. Site: Left Ventroglute[facility]  Lab Review  @THIS  VISIT ONLY@  Assessment:   1. Encounter for surveillance of injectable contraceptive      Plan:   Next appointment due between 6/13 and 6/27.    Minette [place], CMA

## 2022-07-20 NOTE — Patient Instructions (Signed)

## 2022-07-25 ENCOUNTER — Other Ambulatory Visit: Payer: Self-pay

## 2022-09-11 ENCOUNTER — Ambulatory Visit: Payer: Commercial Managed Care - PPO | Admitting: Obstetrics and Gynecology

## 2022-09-11 ENCOUNTER — Other Ambulatory Visit (HOSPITAL_COMMUNITY)
Admission: RE | Admit: 2022-09-11 | Discharge: 2022-09-11 | Disposition: A | Discharge status: Home / Self Care | Payer: Commercial Managed Care - PPO | Source: Ambulatory Visit | Attending: Obstetrics and Gynecology | Admitting: Obstetrics and Gynecology

## 2022-09-11 ENCOUNTER — Other Ambulatory Visit: Payer: Self-pay

## 2022-09-11 ENCOUNTER — Other Ambulatory Visit: Payer: Self-pay | Admitting: Certified Nurse Midwife

## 2022-09-11 ENCOUNTER — Ambulatory Visit (INDEPENDENT_AMBULATORY_CARE_PROVIDER_SITE_OTHER): Payer: Commercial Managed Care - PPO

## 2022-09-11 VITALS — BP 143/81 | HR 85 | Resp 16 | Ht 69.0 in | Wt 204.3 lb

## 2022-09-11 DIAGNOSIS — N898 Other specified noninflammatory disorders of vagina: Secondary | ICD-10-CM | POA: Insufficient documentation

## 2022-09-11 DIAGNOSIS — R399 Unspecified symptoms and signs involving the genitourinary system: Secondary | ICD-10-CM | POA: Diagnosis not present

## 2022-09-11 LAB — POCT URINALYSIS DIPSTICK
Bilirubin, UA: NEGATIVE
Glucose, UA: NEGATIVE
Ketones, UA: NEGATIVE
Nitrite, UA: NEGATIVE
Protein, UA: POSITIVE — AB
Spec Grav, UA: 1.025 (ref 1.010–1.025)
Urobilinogen, UA: 0.2 E.U./dL
pH, UA: 6 (ref 5.0–8.0)

## 2022-09-11 MED ORDER — NITROFURANTOIN MONOHYD MACRO 100 MG PO CAPS
100.0000 mg | ORAL_CAPSULE | Freq: Two times a day (BID) | ORAL | 0 refills | Status: DC
  Filled 2022-09-11: qty 14, 7d supply, fill #0

## 2022-09-11 NOTE — Progress Notes (Signed)
    NURSE VISIT NOTE  Subjective:    Patient ID: MYSTIC RODD, female    DOB: [birth year], 42 y.o.   MRN: [id number]  HPI  Patient is a 42 y.o. G30P0202 female who presents for green, odorless, and thin vaginal discharge for 4 day(s). Denies abnormal vaginal bleeding or significant pelvic pain or fever. reports  urinary pressure . Patient denies history of known exposure to STD.   Objective:    BP (!) 143/81   Pulse 85   Resp 16   Ht 5\' 9"  (1.753 m)   Wt 204 lb 4.8 oz (92.7 kg)   BMI 30.17 kg/m    @THIS  VISIT ONLY@  Assessment:   1. Vaginal discharge     Urine dipstick shows positive for RBC's and positive for leukocytes.  Micro exam: not done.   Plan:   Aptima swab for G/C, Chlymadia, BV and yeast sent to lab. Urine Culture sent to lab Treatment: Macrobid 100 mg bid for 14 days. Treatment for vaginal discharge will be sent when results are back. ROV prn if symptoms persist or worsen.   Santiago Bumpers, CMA [facility]

## 2022-09-13 LAB — CERVICOVAGINAL ANCILLARY ONLY
Bacterial Vaginitis (gardnerella): NEGATIVE
Candida Glabrata: NEGATIVE
Candida Vaginitis: NEGATIVE
Chlamydia: NEGATIVE
Comment: NEGATIVE
Comment: NEGATIVE
Comment: NEGATIVE
Comment: NEGATIVE
Comment: NEGATIVE
Comment: NORMAL
Neisseria Gonorrhea: NEGATIVE
Trichomonas: NEGATIVE

## 2022-09-13 LAB — URINE CULTURE

## 2022-09-15 ENCOUNTER — Encounter: Payer: Self-pay | Admitting: Certified Nurse Midwife

## 2022-10-13 ENCOUNTER — Ambulatory Visit (INDEPENDENT_AMBULATORY_CARE_PROVIDER_SITE_OTHER): Payer: Commercial Managed Care - PPO

## 2022-10-13 VITALS — BP 126/72 | HR 87 | Ht 70.0 in | Wt 204.0 lb

## 2022-10-13 DIAGNOSIS — Z3042 Encounter for surveillance of injectable contraceptive: Secondary | ICD-10-CM | POA: Diagnosis not present

## 2022-10-13 MED ORDER — MEDROXYPROGESTERONE ACETATE 150 MG/ML IM SUSY
150.0000 mg | PREFILLED_SYRINGE | Freq: Once | INTRAMUSCULAR | Status: AC
  Administered 2022-10-13: 150 mg via INTRAMUSCULAR

## 2022-10-13 NOTE — Progress Notes (Signed)
    NURSE VISIT NOTE  Subjective:    Patient ID: Anita Lynch, female    DOB: 08/27/1980, 42 y.o.   MRN: [id number]  HPI  Patient is a 42 y.o. G42P0202 female who presents for depo provera injection.   Objective:    Ht 5\' 10"  (1.778 m)   Wt 204 lb (92.5 kg)   BMI 29.27 kg/m   Last Annual: 11/28/21. Last pap: 11/28/21. Last Depo-Provera: 07/20/22. Side Effects if any: none. Serum HCG indicated? No . Depo-Provera 150 mg IM given by: Donnetta Hail, CMA. Site: Right Deltoid   Assessment:   1. Encounter for surveillance of injectable contraceptive      Plan:   Next appointment due between Sept 6 and Sept 20.    Donnetta Hail, CMA

## 2022-10-13 NOTE — Patient Instructions (Signed)

## 2022-10-31 ENCOUNTER — Other Ambulatory Visit: Payer: Self-pay

## 2022-11-01 ENCOUNTER — Other Ambulatory Visit: Payer: Self-pay | Admitting: Oncology

## 2022-11-01 DIAGNOSIS — Z006 Encounter for examination for normal comparison and control in clinical research program: Secondary | ICD-10-CM

## 2022-11-09 ENCOUNTER — Other Ambulatory Visit: Payer: Self-pay

## 2022-11-14 ENCOUNTER — Other Ambulatory Visit (HOSPITAL_COMMUNITY): Payer: Self-pay

## 2022-11-27 ENCOUNTER — Other Ambulatory Visit: Payer: Self-pay | Admitting: Family Medicine

## 2022-11-27 DIAGNOSIS — Z1231 Encounter for screening mammogram for malignant neoplasm of breast: Secondary | ICD-10-CM

## 2022-12-19 ENCOUNTER — Ambulatory Visit
Admission: RE | Admit: 2022-12-19 | Discharge: 2022-12-19 | Disposition: A | Discharge status: Home / Self Care | Payer: Commercial Managed Care - PPO | Source: Ambulatory Visit | Attending: Family Medicine | Admitting: Family Medicine

## 2022-12-19 DIAGNOSIS — Z1231 Encounter for screening mammogram for malignant neoplasm of breast: Secondary | ICD-10-CM | POA: Diagnosis not present

## 2023-01-05 ENCOUNTER — Ambulatory Visit (INDEPENDENT_AMBULATORY_CARE_PROVIDER_SITE_OTHER): Payer: Commercial Managed Care - PPO

## 2023-01-05 VITALS — BP 132/86 | HR 84 | Ht 70.0 in | Wt 208.3 lb

## 2023-01-05 DIAGNOSIS — Z3042 Encounter for surveillance of injectable contraceptive: Secondary | ICD-10-CM

## 2023-01-05 MED ORDER — MEDROXYPROGESTERONE ACETATE 150 MG/ML IM SUSP
150.0000 mg | Freq: Once | INTRAMUSCULAR | Status: AC
Start: 2023-01-05 — End: 2023-01-05
  Administered 2023-01-05: 150 mg via INTRAMUSCULAR

## 2023-01-05 NOTE — Progress Notes (Addendum)
NURSE VISIT NOTE  Subjective:    Patient ID: Anita Lynch, female    DOB: 12-06-1980, 42 y.o.   MRN: [id number]  HPI  Patient is a 42 y.o. G38P0202 female who presents for depo provera injection.   Objective:    BP 132/86   Pulse 84   Ht 5\' 10"  (1.778 m)   Wt 208 lb 4.8 oz (94.5 kg)   BMI 29.89 kg/m   Last Annual: 11/28/21. Last pap: 11/28/21. Last Depo-Provera: 10/13/22. Side Effects if any: n/a. Serum HCG indicated? N/a. Depo-Provera 150 mg IM given by: Georgiana Shore, CMA. Site: Right Upper Outer Quandrant    Assessment:   1. Encounter for surveillance of injectable contraceptive      Plan:   Next appointment due between 03/23/23 and 04/06/23.    Loman Chroman, CMA

## 2023-01-26 ENCOUNTER — Other Ambulatory Visit: Payer: Self-pay | Admitting: Family Medicine

## 2023-01-26 ENCOUNTER — Other Ambulatory Visit: Payer: Self-pay

## 2023-01-26 DIAGNOSIS — E559 Vitamin D deficiency, unspecified: Secondary | ICD-10-CM

## 2023-01-26 NOTE — Telephone Encounter (Signed)
Requested medication (s) are due for refill today: yes  Requested medication (s) are on the active medication list: yes  Last refill:  04/27/22 #26  Future visit scheduled: no  Notes to clinic:  med not delegated to NT to RF   Requested Prescriptions  Pending Prescriptions Disp Refills   Vitamin D, Ergocalciferol, (DRISDOL) 1.25 MG (50000 UNIT) CAPS capsule [Pharmacy Med Name: Vitamin D, Ergocalciferol, (DRISDOL) 1.25 MG (50000 UNIT) Cap capsule] 26 capsule 0    Sig: Take 1 capsule (50,000 Units total) by mouth every 7 (seven) days.     Endocrinology:  Vitamins - Vitamin D Supplementation 2 Failed - 01/26/2023 12:19 PM      Failed - Manual Review: Route requests for 50,000 IU strength to the provider      Failed - Ca in normal range and within 360 days    Calcium  Date Value Ref Range Status  10/26/2021 9.5 8.7 - 10.2 mg/dL Final         Failed - Vitamin D in normal range and within 360 days    Vit D, 25-Hydroxy  Date Value Ref Range Status  10/26/2021 26.2 (L) 30.0 - 100.0 ng/mL Final    Comment:    Vitamin D deficiency has been defined by the Institute of Medicine and an Endocrine Society practice guideline as a level of serum 25-OH vitamin D less than 20 ng/mL (1,2). The Endocrine Society went on to further define vitamin D insufficiency as a level between 21 and 29 ng/mL (2). 1. IOM (Institute of Medicine). 2010. Dietary reference    intakes for calcium and D. [place]: The    [employer]. 2. Holick MF, Binkley [place], Bischoff-Ferrari HA, et al.    Evaluation, treatment, and prevention of vitamin D    deficiency: an Endocrine Society clinical practice    guideline. JCEM. 2011 Jul; 96(7):1911-30.          Failed - Valid encounter within last 12 months    Recent Outpatient Visits           1 year ago Annual physical exam   [facility] Jacky Kindle, FNP   1 year ago Anxiety   [facility] Jacky Kindle, FNP   2 years ago Anxiety   [facility] [place], Marzella Schlein, MD   2 years ago Routine health maintenance   [place] [facility], Eula Fried, FNP   5 years ago Need for tetanus booster   [facility] [place], [place]

## 2023-01-28 ENCOUNTER — Other Ambulatory Visit: Payer: Self-pay

## 2023-02-04 ENCOUNTER — Other Ambulatory Visit: Payer: Self-pay

## 2023-02-04 ENCOUNTER — Other Ambulatory Visit: Payer: Self-pay | Admitting: Family Medicine

## 2023-02-04 DIAGNOSIS — E559 Vitamin D deficiency, unspecified: Secondary | ICD-10-CM

## 2023-02-05 NOTE — Telephone Encounter (Signed)
Called patient to schedule appt for medication refills and annual exam. No answer. LVMTCB

## 2023-02-05 NOTE — Telephone Encounter (Signed)
Requested medication (s) are due for refill today: yes   Requested medication (s) are on the active medication list: yes   Last refill:  04/27/22 #26 0 refills  Future visit scheduled: no   Notes to clinic:  protocol failed last labs 10/26/21. Called patient to schedule appt for med refills/ annual exam. No answer, LVMTCB . Do you want to continue refills?     Requested Prescriptions  Pending Prescriptions Disp Refills   Vitamin D, Ergocalciferol, (DRISDOL) 1.25 MG (50000 UNIT) CAPS capsule [Pharmacy Med Name: Vitamin D, Ergocalciferol, (DRISDOL) 1.25 MG (50000 UNIT) Cap capsule] 26 capsule 0    Sig: Take 1 capsule (50,000 Units total) by mouth every 7 (seven) days.     Endocrinology:  Vitamins - Vitamin D Supplementation 2 Failed - 02/04/2023  7:37 PM      Failed - Manual Review: Route requests for 50,000 IU strength to the provider      Failed - Ca in normal range and within 360 days    Calcium  Date Value Ref Range Status  10/26/2021 9.5 8.7 - 10.2 mg/dL Final         Failed - Vitamin D in normal range and within 360 days    Vit D, 25-Hydroxy  Date Value Ref Range Status  10/26/2021 26.2 (L) 30.0 - 100.0 ng/mL Final    Comment:    Vitamin D deficiency has been defined by the Institute of Medicine and an Endocrine Society practice guideline as a level of serum 25-OH vitamin D less than 20 ng/mL (1,2). The Endocrine Society went on to further define vitamin D insufficiency as a level between 21 and 29 ng/mL (2). 1. IOM (Institute of Medicine). 2010. Dietary reference    intakes for calcium and D. [place]: The    [employer]. 2. Holick MF, Binkley [place], Bischoff-Ferrari HA, et al.    Evaluation, treatment, and prevention of vitamin D    deficiency: an Endocrine Society clinical practice    guideline. JCEM. 2011 Jul; 96(7):1911-30.          Failed - Valid encounter within last 12 months    Recent Outpatient Visits           1 year ago Annual physical  exam   [facility] Jacky Kindle, FNP   1 year ago Anxiety   [facility]y Kindle, FNP   2 years ago Anxiety   [facility] [place], Marzella Schlein, MD   2 years ago Routine health maintenance   [place], Eula Fried, FNP   5 years ago Need for tetanus booster   [facility] [place], [place]

## 2023-02-07 ENCOUNTER — Other Ambulatory Visit: Payer: Self-pay

## 2023-02-18 NOTE — Progress Notes (Unsigned)
PCP:  Jacky Kindle, FNP   No chief complaint on file.    HPI:      [name] is a 42 y.o. G2P0202 whose LMP was No LMP recorded. Patient has had an injection., presents today for her annual examination.  Her menses are absent due to depo  Sex activity: {sex active: 315163}.  Last Pap: 11/28/21 Results were: no abnormalities /neg HPV DNA  Hx of STDs: {STD hx:14358}  Last mammogram: 12/19/22 Results were: normal--routine follow-up in 12 months There is no FH of breast cancer. There is no FH of ovarian cancer. The patient {does:18564} do self-breast exams. Pt is MyRisk neg except NTHL1 VUS***  Tobacco use: {tob:20664} Alcohol use: {Alcohol:11675} No drug use.  Exercise: {exercise:31265}  She {does:18564} get adequate calcium and Vitamin D in her diet.  Patient Active Problem List   Diagnosis Date Noted   Annual physical exam 10/26/2021   Encounter for hepatitis C screening test for low risk patient 10/26/2021   Encounter for screening mammogram for malignant neoplasm of breast 10/26/2021   Cigarette nicotine dependence without complication 10/26/2021   Anxiety 07/19/2020   Vitamin D deficiency 08/20/2017    Past Surgical History:  Procedure Laterality Date   CESAREAN SECTION  2002,2012    Family History  Problem Relation Age of Onset   Hypertension Mother    Uterine cancer Mother 79   Hyperlipidemia Father    Hypertension Father    Lupus Sister    Breast cancer Maternal Grandmother        56   Diabetes Paternal Grandmother    Dementia Paternal Grandfather    Alzheimer's disease Paternal Grandfather    Colon cancer Maternal Aunt 30   Ovarian cancer Paternal Aunt     Social History   Socioeconomic History   Marital status: Single    Spouse name: Not on file   Number of children: Not on file   Years of education: Not on file   Highest education level: Not on file  Occupational History   Not on file  Tobacco Use   Smoking status: Every Day     Types: Cigarettes   Smokeless tobacco: Never   Tobacco comments:    5 cigs/day; encourage use of lozenge/gum  Vaping Use   Vaping status: Never Used  Substance and Sexual Activity   Alcohol use: No    Alcohol/week: 0.0 standard drinks of alcohol   Drug use: No   Sexual activity: Yes    Birth control/protection: Surgical, Injection  Other Topics Concern   Not on file  Social History Narrative   Not on file   Social Determinants of Health   Financial Resource Strain: Not on file  Food Insecurity: Not on file  Transportation Needs: Not on file  Physical Activity: Not on file  Stress: Not on file  Social Connections: Not on file  Intimate Partner Violence: Not on file     Current Outpatient Medications:    buPROPion (WELLBUTRIN SR) 150 MG 12 hr tablet, Take 1 tablet (150 mg total) by mouth daily. Patient taking 1/2 tablet daily, Disp: 90 tablet, Rfl: 1   Vitamin D, Ergocalciferol, (DRISDOL) 1.25 MG (50000 UNIT) CAPS capsule, Take 1 capsule (50,000 Units total) by mouth every 7 (seven) days., Disp: 26 capsule, Rfl: 0     ROS:  Review of Systems BREAST: No symptoms   Objective: There were no vitals taken for this visit.   OBGyn Exam  Results: No results found  for this or any previous visit (from the past 24 hour(s)).  Assessment/Plan: No diagnosis found.  No orders of the defined types were placed in this encounter.            GYN counsel {counseling: 16159}     F/U  No follow-ups on file.  Anita Lynch B. Clare Fennimore, PA-C 02/18/2023 2:43 PM

## 2023-02-20 ENCOUNTER — Ambulatory Visit (INDEPENDENT_AMBULATORY_CARE_PROVIDER_SITE_OTHER): Payer: Commercial Managed Care - PPO | Admitting: Obstetrics and Gynecology

## 2023-02-20 ENCOUNTER — Encounter: Payer: Self-pay | Admitting: Obstetrics and Gynecology

## 2023-02-20 VITALS — BP 108/74 | Ht 69.0 in | Wt 212.0 lb

## 2023-02-20 DIAGNOSIS — Z1231 Encounter for screening mammogram for malignant neoplasm of breast: Secondary | ICD-10-CM

## 2023-02-20 DIAGNOSIS — Z01419 Encounter for gynecological examination (general) (routine) without abnormal findings: Secondary | ICD-10-CM | POA: Diagnosis not present

## 2023-02-20 DIAGNOSIS — Z3042 Encounter for surveillance of injectable contraceptive: Secondary | ICD-10-CM

## 2023-02-20 MED ORDER — MEDROXYPROGESTERONE ACETATE 150 MG/ML IM SUSP: 150.0000 mg | INTRAMUSCULAR | Status: DC

## 2023-02-20 NOTE — Patient Instructions (Signed)
I value your feedback and you entrusting us with your care. If you get a [facility] patient survey, I would appreciate you taking the time to let us know about your experience today. Thank you! ? ? ?

## 2023-02-22 ENCOUNTER — Other Ambulatory Visit
Admission: RE | Admit: 2023-02-22 | Discharge: 2023-02-22 | Disposition: A | Discharge status: Home / Self Care | Payer: Commercial Managed Care - PPO | Source: Ambulatory Visit | Attending: Oncology | Admitting: Oncology

## 2023-02-22 DIAGNOSIS — Z006 Encounter for examination for normal comparison and control in clinical research program: Secondary | ICD-10-CM | POA: Insufficient documentation

## 2023-03-03 LAB — HELIX MOLECULAR SCREEN: Genetic Analysis Overall Interpretation: NEGATIVE

## 2023-03-03 LAB — GENECONNECT MOLECULAR SCREEN

## 2023-03-29 ENCOUNTER — Ambulatory Visit: Payer: Commercial Managed Care - PPO

## 2023-03-29 VITALS — BP 135/87 | HR 92 | Ht 69.0 in | Wt 216.0 lb

## 2023-03-29 DIAGNOSIS — Z3042 Encounter for surveillance of injectable contraceptive: Secondary | ICD-10-CM | POA: Diagnosis not present

## 2023-03-29 MED ORDER — MEDROXYPROGESTERONE ACETATE 150 MG/ML IM SUSY
150.0000 mg | PREFILLED_SYRINGE | Freq: Once | INTRAMUSCULAR | Status: AC
  Administered 2023-03-29: 150 mg via INTRAMUSCULAR

## 2023-03-29 NOTE — Progress Notes (Signed)
    NURSE VISIT NOTE  Subjective:    Patient ID: Anita Lynch, female    DOB: March 25, 1981, 42 y.o.   MRN: [id number]  HPI  Patient is a 42 y.o. G29P0202 female who presents for depo provera injection.inisital BP was high retake was 135/87. No other concern at this time.     Last Depo-Provera: 10/13/22. Side Effects if any: none. Serum HCG indicated? No . Depo-Provera 150 mg IM given by: Beverely Pace, CMA. Site right upper quad    Assessment:   1. Encounter for Depo-Provera contraception      Plan:   Next appointment due between FEB 20-march 5    Loney Laurence, CMA

## 2023-04-19 ENCOUNTER — Other Ambulatory Visit: Payer: Self-pay

## 2023-04-19 ENCOUNTER — Ambulatory Visit (INDEPENDENT_AMBULATORY_CARE_PROVIDER_SITE_OTHER): Payer: Commercial Managed Care - PPO | Admitting: Family Medicine

## 2023-04-19 ENCOUNTER — Encounter: Payer: Self-pay | Admitting: Family Medicine

## 2023-04-19 VITALS — BP 148/78 | HR 74 | Resp 16 | Ht 69.0 in | Wt 211.0 lb

## 2023-04-19 DIAGNOSIS — I10 Essential (primary) hypertension: Secondary | ICD-10-CM

## 2023-04-19 DIAGNOSIS — Z0000 Encounter for general adult medical examination without abnormal findings: Secondary | ICD-10-CM | POA: Diagnosis not present

## 2023-04-19 DIAGNOSIS — R7309 Other abnormal glucose: Secondary | ICD-10-CM

## 2023-04-19 DIAGNOSIS — E559 Vitamin D deficiency, unspecified: Secondary | ICD-10-CM

## 2023-04-19 MED ORDER — AMLODIPINE BESYLATE 5 MG PO TABS
5.0000 mg | ORAL_TABLET | Freq: Every day | ORAL | 0 refills | Status: DC
  Filled 2023-04-19: qty 90, 90d supply, fill #0

## 2023-04-19 NOTE — Progress Notes (Signed)
Complete physical exam  Patient: Anita Lynch   DOB: 09-Sep-1980   42 y.o. Female  MRN: [id number] Visit Date: 04/19/2023  Today's healthcare provider: Jacky Kindle, FNP  Introduced to nurse practitioner role and practice setting.  All questions answered.  Discussed provider/patient relationship and expectations.  Chief Complaint  Patient presents with   Annual Exam   Subjective    Anita Lynch is a 42 y.o. female who presents today for a complete physical exam.  She reports consuming a general diet. The patient does not participate in regular exercise at present. She generally feels fairly well. She reports sleeping fairly well. She does have additional problems to discuss today.   HPI   Eating "high protein" 60+ grams/day.  Past Medical History:  Diagnosis Date   Allergy    Anemia    Anxiety    BRCA negative 11/2021   MyRisk neg except NTHL1 VUS; IBIS=15.2%/riskscore=16.9%   Family history of ovarian cancer    7/21 cancer genetic testing letter sent   Past Surgical History:  Procedure Laterality Date   CESAREAN SECTION  2002,2012   TUBAL LIGATION  07/28/2010   Social History   Socioeconomic History   Marital status: Single    Spouse name: Not on file   Number of children: Not on file   Years of education: Not on file   Highest education level: Some college, no degree  Occupational History   Not on file  Tobacco Use   Smoking status: Former    Current packs/day: 0.00    Types: Cigarettes    Quit date: 12/06/2021    Years since quitting: 1.3   Smokeless tobacco: Never   Tobacco comments:    5 cigs/day; encourage use of lozenge/gum  Vaping Use   Vaping status: Never Used  Substance and Sexual Activity   Alcohol use: No   Drug use: No   Sexual activity: Yes    Birth control/protection: Injection, Surgical  Other Topics Concern   Not on file  Social History Narrative   Not on file   Social Drivers of Health   Financial Resource  Strain: Medium Risk (04/15/2023)   Overall Financial Resource Strain (CARDIA)    Difficulty of Paying Living Expenses: Somewhat hard  Food Insecurity: Food Insecurity Present (04/15/2023)   Hunger Vital Sign    Worried About Running Out of Food in the Last Year: Sometimes true    Ran Out of Food in the Last Year: Sometimes true  Transportation Needs: No Transportation Needs (04/15/2023)   PRAPARE - Administrator, Civil Service (Medical): No    Lack of Transportation (Non-Medical): No  Physical Activity: Insufficiently Active (04/15/2023)   Exercise Vital Sign    Days of Exercise per Week: 3 days    Minutes of Exercise per Session: 10 min  Stress: Stress Concern Present (04/15/2023)   [employer] of Occupational Health - Occupational Stress Questionnaire    Feeling of Stress : To some extent  Social Connections: Unknown (04/15/2023)   Social Connection and Isolation Panel [NHANES]    Frequency of Communication with Friends and Family: More than three times a week    Frequency of Social Gatherings with Friends and Family: Once a week    Attends Religious Services: Patient declined    Database administrator or Organizations: No    Attends Engineer, structural: Not on file    Marital Status: Divorced  Intimate Partner Violence: Not on  file   Family Status  Relation Name Status   Mother Anita Lynch Deceased   Father Anita Lynch Alive   Sister  Alive   Brother  Alive   MGM  Deceased   MGF  Other   PGM Anita Lynch Deceased   PGF  Deceased   Daughter  Alive   Son  Alive   Mat Aunt Great Deceased   Oceanographer  (Not Specified)  No partnership data on file   Family History  Problem Relation Age of Onset   Hypertension Mother    Uterine cancer Mother 31   Cancer Mother    Hyperlipidemia Father    Hypertension Father    Lupus Sister    Breast cancer Maternal Grandmother        52   Diabetes Paternal Grandmother    Dementia Paternal Grandfather     Alzheimer's disease Paternal Grandfather    Colon cancer Maternal Aunt 77   Ovarian cancer Paternal Aunt    Allergies  Allergen Reactions   Bee Venom Swelling    Bees Bees   Shellfish Allergy Swelling    Patient Care Team: Simmons-Robinson, Tawanna Cooler, MD as PCP - General (Family Medicine)   Medications: Outpatient Medications Prior to Visit  Medication Sig   [DISCONTINUED] buPROPion (WELLBUTRIN SR) 150 MG 12 hr tablet Take 1 tablet (150 mg total) by mouth daily. Patient taking 1/2 tablet daily   Facility-Administered Medications Prior to Visit  Medication Dose Route Frequency Provider   medroxyPROGESTERone (DEPO-PROVERA) injection 150 mg  150 mg Intramuscular Q90 days    Last CBC Lab Results  Component Value Date   WBC 7.2 10/26/2021   HGB 13.9 10/26/2021   HCT 40.7 10/26/2021   MCV 90 10/26/2021   MCH 30.8 10/26/2021   RDW 11.9 10/26/2021   PLT 171 10/26/2021   Last metabolic panel Lab Results  Component Value Date   GLUCOSE 88 10/26/2021   NA 139 10/26/2021   K 4.3 10/26/2021   CL 106 10/26/2021   CO2 20 10/26/2021   BUN 9 10/26/2021   CREATININE 0.77 10/26/2021   EGFR 100 10/26/2021   CALCIUM 9.5 10/26/2021   PROT 6.6 10/26/2021   ALBUMIN 4.1 10/26/2021   LABGLOB 2.5 10/26/2021   AGRATIO 1.6 10/26/2021   BILITOT 0.2 10/26/2021   ALKPHOS 69 10/26/2021   AST 11 10/26/2021   ALT 11 10/26/2021   Last lipids Lab Results  Component Value Date   CHOL 147 10/26/2021   HDL 37 (L) 10/26/2021   LDLCALC 97 10/26/2021   TRIG 61 10/26/2021   CHOLHDL 4.0 10/26/2021   Last hemoglobin A1c Lab Results  Component Value Date   HGBA1C 5.5 08/14/2016   Last thyroid functions Lab Results  Component Value Date   TSH 1.160 10/26/2021   Last vitamin D Lab Results  Component Value Date   VD25OH 26.2 (L) 10/26/2021   Last vitamin B12 and Folate No results found for: "VITAMINB12", "FOLATE"    Objective    BP (!) 148/78   Pulse 74   Resp 16   Ht 5\' 9"   (1.753 m)   Wt 211 lb (95.7 kg)   SpO2 99%   BMI 31.16 kg/m   BP Readings from Last 3 Encounters:  04/19/23 (!) 148/78  03/29/23 135/87  02/20/23 108/74   Wt Readings from Last 3 Encounters:  04/19/23 211 lb (95.7 kg)  03/29/23 216 lb (98 kg)  02/20/23 212 lb (96.2 kg)   SpO2 Readings from  Last 3 Encounters:  04/19/23 99%  10/26/21 99%  01/18/21 100%   Physical Exam Vitals and nursing note reviewed.  Constitutional:      General: She is awake. She is not in acute distress.    Appearance: Normal appearance. She is well-developed and well-groomed. She is obese. She is not ill-appearing, toxic-appearing or diaphoretic.  HENT:     Head: Normocephalic and atraumatic.     Jaw: There is normal jaw occlusion. No trismus, tenderness, swelling or pain on movement.     Right Ear: Hearing, tympanic membrane, ear canal and external ear normal. There is no impacted cerumen.     Left Ear: Hearing, tympanic membrane, ear canal and external ear normal. There is no impacted cerumen.     Nose: Nose normal. No congestion or rhinorrhea.     Right Turbinates: Not enlarged, swollen or pale.     Left Turbinates: Not enlarged, swollen or pale.     Right Sinus: No maxillary sinus tenderness or frontal sinus tenderness.     Left Sinus: No maxillary sinus tenderness or frontal sinus tenderness.     Mouth/Throat:     Lips: Pink.     Mouth: Mucous membranes are moist. No injury.     Tongue: No lesions.     Pharynx: Oropharynx is clear. Uvula midline. No pharyngeal swelling, oropharyngeal exudate, posterior oropharyngeal erythema or uvula swelling.     Tonsils: No tonsillar exudate or tonsillar abscesses.  Eyes:     General: Lids are normal. Lids are everted, no foreign bodies appreciated. Vision grossly intact. Gaze aligned appropriately. No allergic shiner or visual field deficit.       Right eye: No discharge.        Left eye: No discharge.     Extraocular Movements: Extraocular movements intact.      Conjunctiva/sclera: Conjunctivae normal.     Right eye: Right conjunctiva is not injected. No exudate.    Left eye: Left conjunctiva is not injected. No exudate.    Pupils: Pupils are equal, round, and reactive to light.  Neck:     Thyroid: No thyroid mass, thyromegaly or thyroid tenderness.     Vascular: No carotid bruit.     Trachea: Trachea normal.  Cardiovascular:     Rate and Rhythm: Normal rate and regular rhythm.     Pulses: Normal pulses.          Carotid pulses are 2+ on the right side and 2+ on the left side.      Radial pulses are 2+ on the right side and 2+ on the left side.       Dorsalis pedis pulses are 2+ on the right side and 2+ on the left side.       Posterior tibial pulses are 2+ on the right side and 2+ on the left side.     Heart sounds: Normal heart sounds, S1 normal and S2 normal. No murmur heard.    No friction rub. No gallop.  Pulmonary:     Effort: Pulmonary effort is normal. No respiratory distress.     Breath sounds: Normal breath sounds and air entry. No stridor. No wheezing, rhonchi or rales.  Chest:     Chest wall: No tenderness.  Abdominal:     General: Abdomen is flat. Bowel sounds are normal. There is no distension.     Palpations: Abdomen is soft. There is no mass.     Tenderness: There is no abdominal tenderness. There is no right  CVA tenderness, left CVA tenderness, guarding or rebound.     Hernia: No hernia is present.  Genitourinary:    Comments: Exam deferred; denies complaints Musculoskeletal:        General: No swelling, tenderness, deformity or signs of injury. Normal range of motion.     Cervical back: Full passive range of motion without pain, normal range of motion and neck supple. No edema, rigidity or tenderness. No muscular tenderness.     Right lower leg: No edema.     Left lower leg: No edema.  Lymphadenopathy:     Cervical: No cervical adenopathy.     Right cervical: No superficial, deep or posterior cervical adenopathy.     Left cervical: No superficial, deep or posterior cervical adenopathy.  Skin:    General: Skin is warm and dry.     Capillary Refill: Capillary refill takes less than 2 seconds.     Coloration: Skin is not jaundiced or pale.     Findings: No bruising, erythema, lesion or rash.  Neurological:     General: No focal deficit present.     Mental Status: She is alert and oriented to person, place, and time. Mental status is at baseline.     GCS: GCS eye subscore is 4. GCS verbal subscore is 5. GCS motor subscore is 6.     Sensory: Sensation is intact. No sensory deficit.     Motor: Motor function is intact. No weakness.     Coordination: Coordination is intact. Coordination normal.     Gait: Gait is intact. Gait normal.  Psychiatric:        Attention and Perception: Attention and perception normal.        Mood and Affect: Mood and affect normal.        Speech: Speech normal.        Behavior: Behavior normal. Behavior is cooperative.        Thought Content: Thought content normal.        Cognition and Memory: Cognition and memory normal.        Judgment: Judgment normal.     Last depression screening scores    04/19/2023   10:33 AM 10/26/2021    8:37 AM 07/25/2021    8:39 AM  PHQ 2/9 Scores  PHQ - 2 Score 2 0 1  PHQ- 9 Score 4 4    Last fall risk screening    04/19/2023   12:43 PM  Fall Risk   Falls in the past year? 0  Number falls in past yr: 0  Injury with Fall? 0   Last Audit-C alcohol use screening    04/15/2023    8:39 AM  Alcohol Use Disorder Test (AUDIT)  1. How often do you have a drink containing alcohol? 0   A score of 3 or more in women, and 4 or more in men indicates increased risk for alcohol abuse, EXCEPT if all of the points are from question 1   No results found for any visits on 04/19/23.  Assessment & Plan    Routine Health Maintenance and Physical Exam  Exercise Activities and Dietary recommendations  Goals   None     Immunization History   Administered Date(s) Administered   Hepatitis B 05/22/2005   Influenza,inj,Quad PF,6+ Mos 01/18/2021   PFIZER(Purple Top)SARS-COV-2 Vaccination 11/14/2019   Td 05/02/2017    Health Maintenance  Topic Date Due   COVID-19 Vaccine (2 - 2024-25 season) 04/24/2023 (Originally 12/24/2022)   INFLUENZA VACCINE  07/23/2023 (Originally 11/23/2022)   Cervical Cancer Screening (HPV/Pap Cotest)  11/29/2026   DTaP/Tdap/Td (2 - Tdap) 05/03/2027   Hepatitis C Screening  Completed   HIV Screening  Completed   HPV VACCINES  Aged Out    Discussed health benefits of physical activity, and encouraged her to engage in regular exercise appropriate for her age and condition.  Problem List Items Addressed This Visit       Cardiovascular and Mediastinum   Primary hypertension   Chronic, worsening Recommend start of norvasc at 5 mg to assist HTN Goal remains 119/79 The 10-year ASCVD risk score (Arnett DK, et al., 2019) is: 6.6%       Relevant Medications   amLODipine (NORVASC) 5 MG tablet     Other   Annual physical exam - Primary   Things to do to keep yourself healthy  - Exercise at least 30-45 minutes a day, 3-4 days a week.  - Eat a low-fat diet with lots of fruits and vegetables, up to 7-9 servings per day.  - Seatbelts can save your life. Wear them always.  - Smoke detectors on every level of your home, check batteries every year.  - Eye Doctor - have an eye exam every 1-2 years  - Safe sex - if you may be exposed to STDs, use a condom.  - Alcohol -  If you drink, do it moderately, less than 2 drinks per day.  - Health Care Power of Attorney. Choose someone to speak for you if you are not able.  - Depression is common in our stressful world.If you're feeling down or losing interest in things you normally enjoy, please come in for a visit.  - Violence - If anyone is threatening or hurting you, please call immediately.       Relevant Orders   CBC with Differential/Platelet   Comprehensive  Metabolic Panel (CMET)   TSH   Lipid panel   Impaired glucose regulation   Recommend A1c; Continue to recommend balanced, lower carb meals. Smaller meal size, adding snacks. Choosing water as drink of choice and increasing purposeful exercise.       Relevant Orders   Hemoglobin A1c   Vitamin D deficiency   Chronic, repeat labs Previously on supplementation       Relevant Orders   Vitamin D (25 hydroxy)   Return in about 6 weeks (around 05/31/2023) for HTN management.    Leilani Merl, FNP, have reviewed all documentation for this visit. The documentation on 04/19/23 for the exam, diagnosis, procedures, and orders are all accurate and complete.  Jacky Kindle, FNP  [facility] [phone] (phone) [phone] (fax)  [facility]

## 2023-04-19 NOTE — Assessment & Plan Note (Signed)
Chronic, worsening Recommend start of norvasc at 5 mg to assist HTN Goal remains 119/79 The 10-year ASCVD risk score (Arnett DK, et al., 2019) is: 6.6%

## 2023-04-19 NOTE — Assessment & Plan Note (Signed)
Chronic, repeat labs Previously on supplementation

## 2023-04-19 NOTE — Assessment & Plan Note (Signed)
Recommend A1c Continue to recommend balanced, lower carb meals. Smaller meal size, adding snacks. Choosing water as drink of choice and increasing purposeful exercise.

## 2023-04-19 NOTE — Assessment & Plan Note (Signed)

## 2023-04-20 ENCOUNTER — Encounter: Payer: Self-pay | Admitting: Family Medicine

## 2023-04-20 LAB — COMPREHENSIVE METABOLIC PANEL
ALT: 16 [IU]/L (ref 0–32)
AST: 15 [IU]/L (ref 0–40)
Albumin: 4.5 g/dL (ref 3.9–4.9)
Alkaline Phosphatase: 72 [IU]/L (ref 44–121)
BUN/Creatinine Ratio: 10 (ref 9–23)
BUN: 9 mg/dL (ref 6–24)
Bilirubin Total: 0.4 mg/dL (ref 0.0–1.2)
CO2: 22 mmol/L (ref 20–29)
Calcium: 9.9 mg/dL (ref 8.7–10.2)
Chloride: 105 mmol/L (ref 96–106)
Creatinine, Ser: 0.86 mg/dL (ref 0.57–1.00)
Globulin, Total: 2.7 g/dL (ref 1.5–4.5)
Glucose: 85 mg/dL (ref 70–99)
Potassium: 4.1 mmol/L (ref 3.5–5.2)
Sodium: 141 mmol/L (ref 134–144)
Total Protein: 7.2 g/dL (ref 6.0–8.5)
eGFR: 86 mL/min/{1.73_m2} (ref >59)

## 2023-04-20 LAB — CBC WITH DIFFERENTIAL/PLATELET
Basophils Absolute: 0 10*3/uL (ref 0.0–0.2)
Basos: 1 %
EOS (ABSOLUTE): 0.2 10*3/uL (ref 0.0–0.4)
Eos: 3 %
Hematocrit: 41.9 % (ref 34.0–46.6)
Hemoglobin: 14 g/dL (ref 11.1–15.9)
Immature Grans (Abs): 0 10*3/uL (ref 0.0–0.1)
Immature Granulocytes: 0 %
Lymphocytes Absolute: 3.2 10*3/uL — ABNORMAL HIGH (ref 0.7–3.1)
Lymphs: 49 %
MCH: 29.9 pg (ref 26.6–33.0)
MCHC: 33.4 g/dL (ref 31.5–35.7)
MCV: 90 fL (ref 79–97)
Monocytes Absolute: 0.4 10*3/uL (ref 0.1–0.9)
Monocytes: 7 %
Neutrophils Absolute: 2.6 10*3/uL (ref 1.4–7.0)
Neutrophils: 40 %
Platelets: 217 10*3/uL (ref 150–450)
RBC: 4.68 x10E6/uL (ref 3.77–5.28)
RDW: 11.7 % (ref 11.7–15.4)
WBC: 6.5 10*3/uL (ref 3.4–10.8)

## 2023-04-20 LAB — VITAMIN D 25 HYDROXY (VIT D DEFICIENCY, FRACTURES): Vit D, 25-Hydroxy: 37.4 ng/mL (ref 30.0–100.0)

## 2023-04-20 LAB — TSH: TSH: 1.12 u[IU]/mL (ref 0.450–4.500)

## 2023-04-20 LAB — LIPID PANEL
Chol/HDL Ratio: 4 {ratio} (ref 0.0–4.4)
Cholesterol, Total: 160 mg/dL (ref 100–199)
HDL: 40 mg/dL (ref >39)
LDL Chol Calc (NIH): 106 mg/dL — ABNORMAL HIGH (ref 0–99)
Triglycerides: 74 mg/dL (ref 0–149)
VLDL Cholesterol Cal: 14 mg/dL (ref 5–40)

## 2023-04-20 LAB — HEMOGLOBIN A1C
Est. average glucose Bld gHb Est-mCnc: 111 mg/dL
Hgb A1c MFr Bld: 5.5 % (ref 4.8–5.6)

## 2023-05-03 ENCOUNTER — Other Ambulatory Visit: Payer: Self-pay

## 2023-05-31 NOTE — Progress Notes (Signed)
 Established patient visit   Patient: Anita Lynch   DOB: 08-Aug-1980   42 y.o. Female  MRN: [id number] Visit Date: 06/01/2023  Today's healthcare provider: Rockie Agent, MD   Chief Complaint  Patient presents with   Hypertension   Back Pain    Radiating into left leg, keeping her up at night   Subjective     HPI     Back Pain    Additional comments: Radiating into left leg, keeping her up at night      Last edited by Anette Alan CROME, CMA on 06/01/2023  8:18 AM.       Discussed the use of AI scribe software for clinical note transcription with the patient, who gave verbal consent to proceed.  History of Present Illness   Anita Lynch is a 43 year old female with primary hypertension who presents for follow-up.  She is currently on amlodipine  5 mg daily for hypertension but is not taking it regularly. Her blood pressure today is 128/83 mmHg. She has stopped smoking, which she believes has affected her blood pressure.  She has significant pain in her left leg for about two weeks, described as intermittent, radiating from her left buttock down to her foot, and associated with muscle spasms in the left lower leg. She has tried ibuprofen, Tylenol, and heat press without relief. She also took her son's muscle relaxers, which helped with the spasms but not the pain. No recent falls, injuries, or accidents. No urinary or fecal incontinence and numbness in areas where her underwear touches. Pain worsens when lying on her left side.  She mentions a nodule on the right side of her back, which she was told might be a calcium deposit. This nodule has been present for years, has not changed in size, and is not tender to touch. She associates this nodule with her left leg pain, although she has not had any imaging studies done.  She has stopped smoking.         Past Medical History:  Diagnosis Date   Allergy    Anemia    Anxiety    BRCA negative 11/2021    MyRisk neg except NTHL1 VUS; IBIS=15.2%/riskscore=16.9%   Family history of ovarian cancer    7/21 cancer genetic testing letter sent    Medications: Outpatient Medications Prior to Visit  Medication Sig   [DISCONTINUED] amLODipine  (NORVASC ) 5 MG tablet Take 1 tablet (5 mg total) by mouth daily.   Facility-Administered Medications Prior to Visit  Medication Dose Route Frequency Provider   medroxyPROGESTERone  (DEPO-PROVERA ) injection 150 mg  150 mg Intramuscular Q90 days     Review of Systems  Last CBC Lab Results  Component Value Date   WBC 6.5 04/19/2023   HGB 14.0 04/19/2023   HCT 41.9 04/19/2023   MCV 90 04/19/2023   MCH 29.9 04/19/2023   RDW 11.7 04/19/2023   PLT 217 04/19/2023   Last metabolic panel Lab Results  Component Value Date   GLUCOSE 85 04/19/2023   NA 141 04/19/2023   K 4.1 04/19/2023   CL 105 04/19/2023   CO2 22 04/19/2023   BUN 9 04/19/2023   CREATININE 0.86 04/19/2023   EGFR 86 04/19/2023   CALCIUM 9.9 04/19/2023   PROT 7.2 04/19/2023   ALBUMIN 4.5 04/19/2023   LABGLOB 2.7 04/19/2023   AGRATIO 1.6 10/26/2021   BILITOT 0.4 04/19/2023   ALKPHOS 72 04/19/2023   AST 15 04/19/2023   ALT 16 04/19/2023  Last lipids Lab Results  Component Value Date   CHOL 160 04/19/2023   HDL 40 04/19/2023   LDLCALC 106 (H) 04/19/2023   TRIG 74 04/19/2023   CHOLHDL 4.0 04/19/2023   Last hemoglobin A1c Lab Results  Component Value Date   HGBA1C 5.5 04/19/2023   Last thyroid  functions Lab Results  Component Value Date   TSH 1.120 04/19/2023   Last vitamin D  Lab Results  Component Value Date   VD25OH 37.4 04/19/2023        Objective    BP 128/70 (Cuff Size: Normal)   Pulse 81   Resp 16   Ht 5' 9 (1.753 m)   Wt 216 lb 9.6 oz (98.2 kg)   SpO2 100%   BMI 31.99 kg/m   BP Readings from Last 3 Encounters:  06/01/23 128/70  04/19/23 (!) 148/78  03/29/23 135/87   Wt Readings from Last 3 Encounters:  06/01/23 216 lb 9.6 oz (98.2 kg)   04/19/23 211 lb (95.7 kg)  03/29/23 216 lb (98 kg)        Physical Exam  Physical Exam   VITALS: BP- 128/83 MUSCULOSKELETAL: Nodule palpated on right lumbar region, possibly at L3-L4, not tender. Normal range of motion and strength in lower extremities. SKIN: No edema.       No results found for any visits on 06/01/23.  Assessment & Plan     Problem List Items Addressed This Visit       Cardiovascular and Mediastinum   Primary hypertension - Primary   Primary hypertension, currently managed with amlodipine  5 mg daily. Blood pressure today is 128/83 mmHg, slightly elevated but within acceptable range. Patient has not been compliant with medication regimen. Discussed the importance of medication adherence to prevent long-term complications such as heart disease and stroke. Patient expressed a preference to manage blood pressure through lifestyle changes rather than medication. Chronic - discontinue amlodipine  5 mg daily, pt is not taking it  - Encourage medication adherence - Monitor blood pressure regularly        Nervous and Auditory   Sciatica, left side   Relevant Medications   cyclobenzaprine  (FLEXERIL ) 5 MG tablet   predniSONE  (DELTASONE ) 20 MG tablet   Other Relevant Orders   DG Lumbar Spine Complete   DG HIP UNILAT W OR W/O PELVIS 2-3 VIEWS LEFT     Other   Subcutaneous nodule of back   Chronic  Right sided nodule on lumbar region  Soft tissue US  ordered  Lumbar xray ordered       Relevant Orders   US  Pelvis Limited   Acute leg pain, left   Left leg pain with muscle spasms and radiating pain to the foot, consistent with sciatica. Pain has been present for two weeks and is not relieved by NSAIDs or heat. Possible contributing factors include a nodule on the right side of the lumbar region, potentially affecting the left side. Discussed potential benefits of muscle relaxants and prednisone  for symptom relief. If symptoms persist, referral to orthopedics and  potentially neurosurgery may be necessary for further evaluation and management. - Order lumbar and hip x-rays - Order ultrasound of the right lumbar nodule - Prescribe Flexeril  (cyclobenzaprine ) 5-10 mg at bedtime - Prescribe prednisone  40 mg for 3 days, then 20 mg for 2 days - Refer to orthopedics and potentially neurosurgery if symptoms persist      Relevant Medications   cyclobenzaprine  (FLEXERIL ) 5 MG tablet   Other Relevant Orders   DG Lumbar Spine  Complete   DG HIP UNILAT W OR W/O PELVIS 2-3 VIEWS LEFT             Return in about 6 weeks (around 07/13/2023) for sciatica f/u.         Rockie Agent, MD  [facility] [phone] (phone) [phone] (fax)  [facility]

## 2023-06-01 ENCOUNTER — Ambulatory Visit: Payer: Commercial Managed Care - PPO | Admitting: Family Medicine

## 2023-06-01 ENCOUNTER — Other Ambulatory Visit: Payer: Self-pay

## 2023-06-01 ENCOUNTER — Ambulatory Visit
Admission: RE | Admit: 2023-06-01 | Discharge: 2023-06-01 | Disposition: A | Discharge status: Home / Self Care | Payer: Commercial Managed Care - PPO | Source: Ambulatory Visit | Attending: Family Medicine

## 2023-06-01 ENCOUNTER — Ambulatory Visit
Admission: RE | Admit: 2023-06-01 | Discharge: 2023-06-01 | Disposition: A | Discharge status: Home / Self Care | Payer: Commercial Managed Care - PPO | Attending: Family Medicine | Admitting: Family Medicine

## 2023-06-01 ENCOUNTER — Encounter: Payer: Self-pay | Admitting: Family Medicine

## 2023-06-01 VITALS — BP 128/70 | HR 81 | Resp 16 | Ht 69.0 in | Wt 216.6 lb

## 2023-06-01 DIAGNOSIS — M5432 Sciatica, left side: Secondary | ICD-10-CM | POA: Insufficient documentation

## 2023-06-01 DIAGNOSIS — M79605 Pain in left leg: Secondary | ICD-10-CM | POA: Diagnosis not present

## 2023-06-01 DIAGNOSIS — M25552 Pain in left hip: Secondary | ICD-10-CM | POA: Diagnosis not present

## 2023-06-01 DIAGNOSIS — I10 Essential (primary) hypertension: Secondary | ICD-10-CM | POA: Diagnosis not present

## 2023-06-01 DIAGNOSIS — M47817 Spondylosis without myelopathy or radiculopathy, lumbosacral region: Secondary | ICD-10-CM | POA: Diagnosis not present

## 2023-06-01 DIAGNOSIS — R222 Localized swelling, mass and lump, trunk: Secondary | ICD-10-CM

## 2023-06-01 DIAGNOSIS — M545 Low back pain, unspecified: Secondary | ICD-10-CM | POA: Diagnosis not present

## 2023-06-01 MED ORDER — CYCLOBENZAPRINE HCL 5 MG PO TABS
5.0000 mg | ORAL_TABLET | Freq: Every evening | ORAL | 0 refills | Status: DC | PRN
  Filled 2023-06-01: qty 60, 30d supply, fill #0

## 2023-06-01 MED ORDER — PREDNISONE 20 MG PO TABS
ORAL_TABLET | ORAL | 0 refills | Status: AC
  Filled 2023-06-01: qty 8, 5d supply, fill #0

## 2023-06-01 NOTE — Assessment & Plan Note (Signed)
 Left leg pain with muscle spasms and radiating pain to the foot, consistent with sciatica. Pain has been present for two weeks and is not relieved by NSAIDs or heat. Possible contributing factors include a nodule on the right side of the lumbar region, potentially affecting the left side. Discussed potential benefits of muscle relaxants and prednisone  for symptom relief. If symptoms persist, referral to orthopedics and potentially neurosurgery may be necessary for further evaluation and management. - Order lumbar and hip x-rays - Order ultrasound of the right lumbar nodule - Prescribe Flexeril  (cyclobenzaprine ) 5-10 mg at bedtime - Prescribe prednisone  40 mg for 3 days, then 20 mg for 2 days - Refer to orthopedics and potentially neurosurgery if symptoms persist

## 2023-06-01 NOTE — Patient Instructions (Addendum)
 Please report to [facility] located at:  [address]  [place], [place] [address]  You do not need an appointment to have xrays completed.   Our office will follow up with  results once available.  VISIT SUMMARY:  Today, we discussed your ongoing issues with hypertension and new concerns about left leg pain. We reviewed your current medications and lifestyle changes, and we have made some adjustments to your treatment plan to help manage your symptoms more effectively.  YOUR PLAN:  -LEFT LEG PAIN WITH SCIATICA: Sciatica is a condition where pain radiates along the path of the sciatic nerve, which runs from your lower back through your hips and buttocks and down each leg. We will start you on Flexeril  (cyclobenzaprine ) 5-10 mg at bedtime to help with muscle spasms and a short course of prednisone  to reduce inflammation. We have also ordered x-rays of your lumbar spine and hip, as well as an ultrasound of the nodule on your back. If your symptoms do not improve, we may refer you to orthopedics or neurosurgery for further evaluation.  -PRIMARY HYPERTENSION: Hypertension, or high blood pressure, is a condition where the force of the blood against your artery walls is too high, which can lead to heart disease and stroke if not managed properly. Your blood pressure today is 128/83 mmHg. It is important to take your amlodipine  5 mg daily as prescribed to keep your blood pressure under control. We also discussed the importance of lifestyle changes, such as regular physical activity and weight management, to help manage your blood pressure.  -GENERAL HEALTH MAINTENANCE: You have successfully stopped smoking, which is excellent for your overall health and blood pressure management. Continue with smoking cessation, engage in regular physical activity, and maintain a healthy weight to support your overall well-being.  INSTRUCTIONS:  Please schedule a follow-up  appointment after you have completed the x-rays and ultrasound. Go for the imaging studies as soon as possible.

## 2023-06-01 NOTE — Assessment & Plan Note (Signed)
 Chronic  Right sided nodule on lumbar region  Soft tissue US  ordered  Lumbar xray ordered

## 2023-06-01 NOTE — Assessment & Plan Note (Signed)
 Primary hypertension, currently managed with amlodipine  5 mg daily. Blood pressure today is 128/83 mmHg, slightly elevated but within acceptable range. Patient has not been compliant with medication regimen. Discussed the importance of medication adherence to prevent long-term complications such as heart disease and stroke. Patient expressed a preference to manage blood pressure through lifestyle changes rather than medication. Chronic - discontinue amlodipine  5 mg daily, pt is not taking it  - Encourage medication adherence - Monitor blood pressure regularly

## 2023-06-04 ENCOUNTER — Ambulatory Visit: Payer: Commercial Managed Care - PPO

## 2023-06-13 ENCOUNTER — Ambulatory Visit
Admission: RE | Admit: 2023-06-13 | Discharge: 2023-06-13 | Disposition: A | Discharge status: Home / Self Care | Payer: Commercial Managed Care - PPO | Source: Ambulatory Visit | Attending: Family Medicine | Admitting: Family Medicine

## 2023-06-13 DIAGNOSIS — R222 Localized swelling, mass and lump, trunk: Secondary | ICD-10-CM | POA: Diagnosis not present

## 2023-06-14 ENCOUNTER — Telehealth: Payer: Self-pay

## 2023-06-14 NOTE — Telephone Encounter (Signed)
 Final read is not yet available. Will send result note when available

## 2023-06-14 NOTE — Telephone Encounter (Signed)
 Copied from CRM [id number]. Topic: General - Other >> Jun 13, 2023 11:42 AM Geroge Baseman wrote: Reason for CRM: Patient calling to see if her imaging results from 2/7 were completed. I show them as not released. I let the patient know and informed her I would send a message about her inquiry. Please call to advise with any updates.

## 2023-06-15 ENCOUNTER — Ambulatory Visit: Payer: Self-pay | Admitting: Family Medicine

## 2023-06-15 ENCOUNTER — Encounter: Payer: Self-pay | Admitting: Family Medicine

## 2023-06-15 DIAGNOSIS — R222 Localized swelling, mass and lump, trunk: Secondary | ICD-10-CM

## 2023-06-15 NOTE — Telephone Encounter (Signed)
 Left voicemail advising for Dr. Roxan Hockey.

## 2023-06-15 NOTE — Telephone Encounter (Signed)
 Copied from CRM [id number]. Topic: Clinical - Lab/Test Results >> Jun 15, 2023  4:15 PM Turkey B wrote: Reason for CRM: pt called in for lab results of spine   Patient called to see if there were any additional notes for her imaging results.  This RN checked and did not see any additional notes on her lower spine imaging or her ultrasound that her and her doctor hadn't already discussed through MyChart. Patient states that the surgeon's office had already contacted her and set up an appointment. Patient states that she didn't see anything new on MyChart when she checked her own records so she was wondering if there was anything additional.  This RN advised her that I didn't see anything new since she talked with her PCP through MyChart. Patient states that was all she wanted to know.  Reason for Disposition  Health Information question, no triage required and triager able to answer question  Answer Assessment - Initial Assessment Questions 1. REASON FOR CALL or QUESTION: "What is your reason for calling today?" or "How can I best help you?" or "What question do you have that I can help answer?"     Patient called to see if there were any additional notes for her imaging results.  This RN checked and did not see any additional notes on her lower spine imaging or her ultrasound that her and her doctor hadn't already discussed through MyChart. Patient states that the surgeon's office had already contacted her and set up an appointment. Patient states that she didn't see anything new on MyChart when she checked her own records so she was wondering if there was anything additional.  This RN advised her that I didn't see anything new since she talked with her PCP through MyChart. Patient states that was all she wanted to know.  Protocols used: Information Only Call - No Triage-A-AH

## 2023-06-18 ENCOUNTER — Other Ambulatory Visit: Payer: Self-pay

## 2023-06-18 DIAGNOSIS — R222 Localized swelling, mass and lump, trunk: Secondary | ICD-10-CM

## 2023-06-19 DIAGNOSIS — H524 Presbyopia: Secondary | ICD-10-CM | POA: Diagnosis not present

## 2023-06-21 ENCOUNTER — Encounter: Payer: Self-pay | Admitting: General Surgery

## 2023-06-21 ENCOUNTER — Ambulatory Visit (INDEPENDENT_AMBULATORY_CARE_PROVIDER_SITE_OTHER): Payer: Commercial Managed Care - PPO | Admitting: General Surgery

## 2023-06-21 ENCOUNTER — Ambulatory Visit (INDEPENDENT_AMBULATORY_CARE_PROVIDER_SITE_OTHER): Payer: Commercial Managed Care - PPO

## 2023-06-21 VITALS — BP 130/78 | HR 83 | Temp 98.1°F | Ht 69.0 in | Wt 217.0 lb

## 2023-06-21 VITALS — BP 131/86 | HR 85 | Ht 70.0 in | Wt 218.0 lb

## 2023-06-21 DIAGNOSIS — Z3042 Encounter for surveillance of injectable contraceptive: Secondary | ICD-10-CM | POA: Diagnosis not present

## 2023-06-21 DIAGNOSIS — R222 Localized swelling, mass and lump, trunk: Secondary | ICD-10-CM | POA: Diagnosis not present

## 2023-06-21 MED ORDER — MEDROXYPROGESTERONE ACETATE 150 MG/ML IM SUSY
150.0000 mg | PREFILLED_SYRINGE | Freq: Once | INTRAMUSCULAR | Status: AC
  Administered 2023-06-21: 150 mg via INTRAMUSCULAR

## 2023-06-21 NOTE — Progress Notes (Signed)
    NURSE VISIT NOTE  Subjective:    Patient ID: Anita Lynch, female    DOB: 1980-11-08, 43 y.o.   MRN: [id number]  HPI  Patient is a 43 y.o. G80P0202 female who presents for depo provera injection.   Objective:    Ht 5\' 10"  (1.778 m)   Wt 218 lb (98.9 kg)   BMI 31.28 kg/m   Last Annual: 02/20/23. Last pap: 11/28/21. Last Depo-Provera: 03/29/23. Side Effects if any: none. Serum HCG indicated? No . Depo-Provera 150 mg IM given by: Donnetta Hail, CMA. Site: Left Upper Outer Quandrant   Assessment:   1. Encounter for surveillance of injectable contraceptive      Plan:   Next appointment due between May 15 and May 29.    Donnetta Hail, CMA

## 2023-06-21 NOTE — Progress Notes (Signed)
 Patient ID: Dicie Beam, female   DOB: May 20, 1980, 43 y.o.   MRN: [id number]  History of Present Illness Sakura SULLY MANZI is a 43 y.o. female with past medical history as listed below who presents in consultation for right lower back cyst.  The patient reports that she noticed a mass in her right lower back approximately 11 to 12 years ago.  She says since that time it has not grown.  She denies any overlying skin changes.  She says that when you press on it it does feel painful but she had otherwise has no pain from it.  She denies drainage from the mass.  She does report left hip pain.  Past Medical History Past Medical History:  Diagnosis Date   Allergy    Anemia    Anxiety    BRCA negative 11/2021   MyRisk neg except NTHL1 VUS; IBIS=15.2%/riskscore=16.9%   Family history of ovarian cancer    7/21 cancer genetic testing letter sent      Past Surgical History:  Procedure Laterality Date   CESAREAN SECTION  2002,2012   TUBAL LIGATION  07/28/2010    Allergies  Allergen Reactions   Bee Venom Swelling    Bees Bees   Shellfish Allergy Swelling    Current Outpatient Medications  Medication Sig Dispense Refill   cyclobenzaprine (FLEXERIL) 5 MG tablet Take 1-2 tablets (5-10 mg total) by mouth at bedtime as needed for muscle spasms. (Patient not taking: Reported on 06/21/2023) 60 tablet 0   Current Facility-Administered Medications  Medication Dose Route Frequency Provider Last Rate Last Admin   medroxyPROGESTERone (DEPO-PROVERA) injection 150 mg  150 mg Intramuscular Q90 days         Family History Family History  Problem Relation Age of Onset   Hypertension Mother    Uterine cancer Mother 64   Cancer Mother    Hyperlipidemia Father    Hypertension Father    Lupus Sister    Breast cancer Maternal Grandmother        68   Diabetes Paternal Grandmother    Dementia Paternal Grandfather    Alzheimer's disease Paternal Grandfather    Colon cancer Maternal Aunt 79    Ovarian cancer Paternal Aunt       Social History Social History   Tobacco Use   Smoking status: Former    Current packs/day: 0.00    Types: Cigarettes    Quit date: 12/06/2021    Years since quitting: 1.5    Passive exposure: Past   Smokeless tobacco: Never   Tobacco comments:    5 cigs/day; encourage use of lozenge/gum  Vaping Use   Vaping status: Never Used  Substance Use Topics   Alcohol use: No   Drug use: No       ROS Full ROS of systems performed and is otherwise negative there than what is stated in the HPI  Physical Exam Blood pressure 130/78, pulse 83, temperature 98.1 F (36.7 C), height 5\' 9"  (1.753 m), weight 217 lb (98.4 kg), SpO2 99%.  Alert and oriented x 3, normal breathing on room air, regular rate and rhythm, abdomen soft and nontender, right lower back there is a area of soft tissue swelling.  This is minimally painful to palpation and is mobile.  There are no overlying skin changes or drainage from the area.  Data Reviewed I reviewed her ultrasound independently.  It does look like there is a approximately 2 cm well-circumscribed mass within the subcutaneous tissue without concerning features  I have personally reviewed the patient's imaging and medical records.    Assessment/Plan    The patient is a 43 year old with right lower back mass.  I discussed with her that this is most likely a benign mass likely epidermal inclusion cyst or something of that nature.  She has had it for 11 years and it has not significantly grown so the risk of it being malignant is extraordinarily low.  I also discussed with her that this is not what is causing her left hip pain.  I talked with her about potential removal of the mass including the risk of infection and bleeding and recurrence.  She says that it is not bothering her very much so she at this time does not feel like she needs to have it removed.  I discussed with her that if that should she change her mind she can  call our office and schedule an appointment otherwise she can see [place].    Kandis Cocking 06/21/2023, 1:44 PM

## 2023-06-21 NOTE — Patient Instructions (Signed)

## 2023-06-21 NOTE — Patient Instructions (Signed)
 Please call us and let us know if the area increases in size, becomes more painful, or becomes bothersome. We can see you for this and discuss having it removed.  Pocket of Fluid in the Skin (Epidermoid Cyst): What to Know  An epidermoid cyst is a small lump under your skin. The cyst contains a substance that is thick and oily. What are the causes? A blocked hair follicle. A hair curls and re-enters the skin instead of growing straight out of the skin. A blocked pore. Irritated skin. An injury to the skin. Certain conditions that are passed from parent to child. Human papillomavirus (HPV). This happens rarely when cysts occur on the bottom of the feet. Long-term sun damage to the skin. What increases the risk? Having acne. Being female. Having an injury to the skin. Being past puberty. Certain conditions that are passed down through family (genetic disorder). What are the signs or symptoms? The only sign of this type of cyst may be a small, painless lump under the skin. These cysts are usually painless, but they can get infected. Symptoms of infection may include: Redness. Inflammation. Tenderness. Warmth. Fever. A bad-smelling substance that drains from the cyst. Pus that drains from the cyst. How is this treated? If a cyst becomes inflamed, treatment may include: Opening and draining the cyst. Antibiotics. Shots of medicines called steroids that help lessen inflammation. Surgery to remove the cyst if it's large, painful, or could turn into cancer. Do not try to open or squeeze a cyst yourself. Follow these instructions at home: Medicines Take your medicines only as told. If you were given antibiotics, take them as told. Do not stop taking them even if you start to feel better. General instructions Keep the area around your cyst clean and dry. Wear loose, dry clothing. Avoid touching your cyst. Check your cyst every day for signs of infection. Check for: Redness, swelling,  or pain. Fluid or blood. Warmth. Pus or a bad smell. Keep all follow-up visits to make sure there's no discomfort or infection. Contact a doctor if: You have any signs of infection. Your cyst doesn't get better or gets worse. You get a cyst that looks different from other cysts you've had. You have a fever. You have redness that spreads from the cyst. This information is not intended to replace advice given to you by your health care provider. Make sure you discuss any questions you have with your health care provider. Document Revised: 11/24/2022 Document Reviewed: 11/24/2022 Elsevier Patient Education  2024 [employer].

## 2023-07-11 NOTE — Progress Notes (Signed)
 "     Established patient visit   Patient: Anita Lynch   DOB: 10-Apr-1981   43 y.o. Female  MRN: [id number] Visit Date: 07/13/2023  Today's healthcare provider: Rockie Agent, MD   Chief Complaint  Patient presents with   Sciatica    She is still having L hip pain the meds are not helping    Subjective     HPI     Sciatica    Additional comments: She is still having L hip pain the meds are not helping       Last edited by Agent Rockie, MD on 07/13/2023  8:24 AM.       Discussed the use of AI scribe software for clinical note transcription with the patient, who gave verbal consent to proceed.  Discussed the use of AI scribe software for clinical note transcription with the patient, who gave verbal consent to proceed.  History of Present Illness Anita Lynch is a 43 year old female who presents with left leg, hip, and lumbar pain.  She experiences persistent pain in the left leg, hip, and lumbar region, described as a tight band sensation similar to tying a band around a finger. The pain is more pronounced at night, affecting sleep, and is less bothersome during the day when she is active.  An x-ray of the lumbar region and pelvis performed on June 01, 2023, showed mild degenerative changes between L5 and S1. The hip x-ray was negative, showing normal hip joint space, a well-seated femoral head, and no fractures or abnormalities. An ultrasound of the pelvis was also conducted for a lumbar lesion.  She was previously prescribed prednisone  40 mg daily for three days, followed by 20 mg for two days, and Flexeril  5-10 mg at bedtime. These medications did not alleviate her pain, and Flexeril  made her feel 'high.' She has also tried NSAIDs such as ibuprofen and Tylenol without relief.  Discomfort is noted in the hip joint when moving her leg towards the window, but no pain is reported in the groin, thigh, knee, or buttock. 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 No numbness, bowel or bladder issues, or pain radiating down the leg.     Past Medical History:  Diagnosis Date   Allergy    Anemia    Anxiety    BRCA negative 11/2021   MyRisk neg except NTHL1 VUS; IBIS=15.2%/riskscore=16.9%   Family history of ovarian cancer    7/21 cancer genetic testing letter sent    Medications: No outpatient medications prior to visit.   Facility-Administered Medications Prior to Visit  Medication Dose Route Frequency Provider   medroxyPROGESTERone  (DEPO-PROVERA ) injection 150 mg  150 mg Intramuscular Q90 days     Review of Systems  Last metabolic panel Lab Results  Component Value Date   GLUCOSE 85 04/19/2023   NA 141 04/19/2023   K 4.1 04/19/2023   CL 105 04/19/2023   CO2 22 04/19/2023   BUN 9 04/19/2023   CREATININE 0.86 04/19/2023   EGFR 86 04/19/2023   CALCIUM 9.9 04/19/2023   PROT 7.2 04/19/2023   ALBUMIN 4.5 04/19/2023   LABGLOB 2.7 04/19/2023   AGRATIO 1.6 10/26/2021   BILITOT 0.4 04/19/2023   ALKPHOS 72 04/19/2023   AST 15 04/19/2023   ALT 16 04/19/2023        Objective    BP 125/77   Pulse 79   Ht 5' 10 (1.778 m)  Wt 213 lb (96.6 kg)   SpO2 100%   BMI 30.56 kg/m  BP Readings from Last 3 Encounters:  07/13/23 125/77  06/21/23 131/86  06/21/23 130/78   Wt Readings from Last 3 Encounters:  07/13/23 213 lb (96.6 kg)  06/21/23 218 lb (98.9 kg)  06/21/23 217 lb (98.4 kg)        Physical Exam  Physical Exam VITALS: BP- 125/77 MUSCULOSKELETAL: Pain in left hip joint on lateral movement. No tenderness in left hip on palpation. No IT band tenderness on left hip. No pain in left hip on resisted abduction, adduction, or flexion.    No results found for any visits on 07/13/23.  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 Hip x-ray is normal, and lumbar x-ray shows mild degenerative changes. Pain is more pronounced at night and unresponsive to prednisone  or Flexeril . Pain is inconsistent with bursitis, and there is no evidence of sciatica. Differential diagnosis includes nerve involvement or musculoskeletal issues. She prefers non-surgical options and is interested in a cortisone injection. Orthopedics can perform cortisone injections with ultrasound guidance for accurate placement. Referral to orthopedics is preferred over neurosurgery as there are no severe neurological symptoms such as numbness, bowel, or bladder dysfunction.  Pt reports NSAIDS and Tylenol are ineffective for pain - Refer to orthopedics for further evaluation and potential cortisone injection with ultrasound guidance.      Relevant Orders   AMB referral to orthopedics   Other Visit Diagnoses       Left buttock pain       Relevant Orders   AMB referral to orthopedics       Assessment & Plan   Lumbar lesion Asymptomatic lumbar lesion evaluated by general surgery. She opted against surgical removal as it does not cause discomfort. - Monitor the lumbar lesion as it is asymptomatic and does not require immediate intervention.  General Health Maintenance Blood pressure is well-controlled at 125/77 mmHg. - Schedule next physical exam for December.  Follow-up Upcoming OB/GYN appointment on May 22nd. Awaiting call from orthopedics for further evaluation of hip and lumbar pain. - Attend OB/GYN appointment on May 22nd. - Await call from orthopedics for appointment scheduling.            No follow-ups on file.       Rockie Agent, MD  [facility] [phone] (phone) [phone] (fax)  [facility] "

## 2023-07-13 ENCOUNTER — Encounter: Payer: Self-pay | Admitting: Family Medicine

## 2023-07-13 ENCOUNTER — Ambulatory Visit: Payer: Commercial Managed Care - PPO | Admitting: Family Medicine

## 2023-07-13 VITALS — BP 125/77 | HR 79 | Ht 70.0 in | Wt 213.0 lb

## 2023-07-13 DIAGNOSIS — M7918 Myalgia, other site: Secondary | ICD-10-CM

## 2023-07-13 DIAGNOSIS — M79605 Pain in left leg: Secondary | ICD-10-CM

## 2023-07-13 NOTE — Assessment & Plan Note (Signed)
 Left hip and lumbar pain Chronic left hip and lumbar pain with mild degenerative changes between L5 and S1. Hip x-ray is normal, and lumbar x-ray shows mild degenerative changes. Pain is more pronounced at night and unresponsive to prednisone or Flexeril. Pain is inconsistent with bursitis, and there is no evidence of sciatica. Differential diagnosis includes nerve involvement or musculoskeletal issues. She prefers non-surgical options and is interested in a cortisone injection. Orthopedics can perform cortisone injections with ultrasound guidance for accurate placement. Referral to orthopedics is preferred over neurosurgery as there are no severe neurological symptoms such as numbness, bowel, or bladder dysfunction.  Pt reports NSAIDS and Tylenol are ineffective for pain - Refer to orthopedics for further evaluation and potential cortisone injection with ultrasound guidance.

## 2023-08-02 ENCOUNTER — Ambulatory Visit (INDEPENDENT_AMBULATORY_CARE_PROVIDER_SITE_OTHER): Admitting: Orthopedic Surgery

## 2023-08-02 DIAGNOSIS — M7062 Trochanteric bursitis, left hip: Secondary | ICD-10-CM

## 2023-08-02 NOTE — Patient Instructions (Signed)

## 2023-08-03 ENCOUNTER — Encounter: Payer: Self-pay | Admitting: Orthopedic Surgery

## 2023-08-03 NOTE — Progress Notes (Signed)
 New Patient Visit  Assessment: Anita Lynch is a 43 y.o. female with the following: 1. Greater trochanteric bursitis of left hip  Plan: [name] has in the left lateral hip, extending into the buttock.  Presentation consistent with greater trochanteric bursitis of the left hip.  She states that she is unable to lay on that side.  We reviewed radiographs together.  These are negative for acute injury.  Pathology was discussed.  I recommended a steroid injection, and she elected to proceed.  Can also consider formal physical therapy.  She will return to clinic as needed.  Follow-up: Return if symptoms worsen or fail to improve.  Subjective:  Chief Complaint  Patient presents with   Left lateral leg pain    Pain in the lateral hip, radiating distally.    History of Present Illness: Anita Lynch is a 43 y.o. female who has been referred by my Ronnald Ramp, MD for evaluation of left leg pain.  She states that she has had worsening pain in the left lateral leg, radiating distally.  She also has some pain within the buttock.  She is unable to lay on her left side.  Medications have not been effective.  Pain gets worse at work.  She does a lot of lifting of patients.  She has tried prednisone, without improvement.  Muscle relaxers are not been effective.   Review of Systems: No fevers or chills No numbness or tingling No chest pain No shortness of breath No bowel or bladder dysfunction No GI distress No headaches   Medical History:  Past Medical History:  Diagnosis Date   Allergy    Anemia    Anxiety    BRCA negative 11/2021   MyRisk neg except NTHL1 VUS; IBIS=15.2%/riskscore=16.9%   Family history of ovarian cancer    7/21 cancer genetic testing letter sent    Past Surgical History:  Procedure Laterality Date   CESAREAN SECTION  2002,2012   TUBAL LIGATION  07/28/2010    Family History  Problem Relation Age of Onset   Hypertension  Mother    Uterine cancer Mother 23   Cancer Mother    Hyperlipidemia Father    Hypertension Father    Lupus Sister    Breast cancer Maternal Grandmother        70   Diabetes Paternal Grandmother    Dementia Paternal Grandfather    Alzheimer's disease Paternal Grandfather    Colon cancer Maternal Aunt 77   Ovarian cancer Paternal Aunt    Social History   Tobacco Use   Smoking status: Former    Current packs/day: 0.00    Types: Cigarettes    Quit date: 12/06/2021    Years since quitting: 1.6    Passive exposure: Past   Smokeless tobacco: Never   Tobacco comments:    5 cigs/day; encourage use of lozenge/gum  Vaping Use   Vaping status: Never Used  Substance Use Topics   Alcohol use: No   Drug use: No    Allergies  Allergen Reactions   Bee Venom Swelling    Bees Bees   Shellfish Allergy Swelling    No outpatient medications have been marked as taking for the 08/02/23 encounter (Office Visit) with Oliver Barre, MD.   Current Facility-Administered Medications for the 08/02/23 encounter (Office Visit) with Oliver Barre, MD  Medication   medroxyPROGESTERone (DEPO-PROVERA) injection 150 mg    Objective: There were no vitals taken for this visit.  Physical Exam:  General: Alert and oriented. and No acute distress. Gait: Left sided antalgic gait.  Evaluation of left hip and buttock demonstrates no deformity.  She has tenderness to palpation of the greater trochanter, as well as within the superior lateral aspect of the buttock.  Negative straight leg raise.  She has good strength.  She is able to maintain hip abduction, without increasing discomfort.  No numbness or tingling.  Sensation is intact distally.  IMAGING: I personally reviewed images previously obtained in clinic  Lumbar spine x-rays were previously obtained.  Mild degenerative changes, especially at L5-S1.  Hip x-ray is negative for acute injury.  Minimal degenerative changes.   New Medications:   No orders of the defined types were placed in this encounter.     Oliver Barre, MD  08/03/2023 3:00 PM

## 2023-09-13 ENCOUNTER — Ambulatory Visit (INDEPENDENT_AMBULATORY_CARE_PROVIDER_SITE_OTHER): Payer: Commercial Managed Care - PPO

## 2023-09-13 VITALS — BP 124/70 | HR 71 | Ht 70.0 in | Wt 218.6 lb

## 2023-09-13 DIAGNOSIS — Z3042 Encounter for surveillance of injectable contraceptive: Secondary | ICD-10-CM | POA: Diagnosis not present

## 2023-09-13 MED ORDER — MEDROXYPROGESTERONE ACETATE 150 MG/ML IM SUSP
150.0000 mg | Freq: Once | INTRAMUSCULAR | Status: AC
Start: 2023-09-13 — End: 2023-09-13
  Administered 2023-09-13: 150 mg via INTRAMUSCULAR

## 2023-09-13 NOTE — Progress Notes (Signed)
    NURSE VISIT NOTE  Subjective:    Patient ID: Anita Lynch, female    DOB: [birth year], 43 y.o.   MRN: [id number]  HPI  Patient is a 43 y.o. G62P0202 female who presents for depo provera  injection.   Objective:    BP 124/70   Pulse 71   Ht 5\' 10"  (1.778 m)   Wt 218 lb 9.6 oz (99.2 kg)   BMI 31.37 kg/m   Last Annual: 02-20-2023. Last pap: 11-28-2021. Last Depo-Provera : 06/21/2023. Side Effects if any: none. Serum HCG indicated? No . Depo-Provera  150 mg IM given by: Doneen Fuelling, CMA. Site: Right Deltoid  Lab Review  No labs  Assessment:   1. Encounter for surveillance of injectable contraceptive      Plan:   Next appointment due between June 7 and June 21.    Doneen Fuelling, CMA [facility]

## 2023-09-13 NOTE — Patient Instructions (Addendum)

## 2023-10-11 ENCOUNTER — Other Ambulatory Visit: Payer: Self-pay

## 2023-10-11 MED ORDER — PREDNISOLONE ACETATE 1 % OP SUSP
OPHTHALMIC | 0 refills | Status: AC
  Filled 2023-10-11: qty 5, 14d supply, fill #0

## 2023-12-06 ENCOUNTER — Ambulatory Visit

## 2023-12-06 VITALS — BP 137/79 | HR 85 | Resp 16 | Ht 70.0 in | Wt 216.0 lb

## 2023-12-06 DIAGNOSIS — Z3042 Encounter for surveillance of injectable contraceptive: Secondary | ICD-10-CM | POA: Diagnosis not present

## 2023-12-06 MED ORDER — MEDROXYPROGESTERONE ACETATE 150 MG/ML IM SUSP
150.0000 mg | Freq: Once | INTRAMUSCULAR | Status: AC
  Administered 2023-12-06: 150 mg via INTRAMUSCULAR

## 2023-12-06 NOTE — Patient Instructions (Signed)

## 2023-12-06 NOTE — Progress Notes (Signed)
    NURSE VISIT NOTE  Subjective:    Patient ID: Anita Lynch, female    DOB: 11-18-1980, 43 y.o.   MRN: [id number]  HPI  Patient is a 43 y.o. G74P0202 female who presents for depo provera injection.   Objective:    BP 137/79   Pulse 85   Resp 16   Ht 5' 10 (1.778 m)   Wt 216 lb (98 kg)   BMI 30.99 kg/m   Last Annual: 02/20/2023. Last pap: 11/28/2021. Last Depo-Provera: 09/13/2023. Side Effects if any: none. Serum HCG indicated? No . Depo-Provera 150 mg IM given by: Camelia Fetters, CMA. Site: Left Deltoid  Lab Review  No results found for any visits on 12/06/23.  Assessment:   1. Encounter for surveillance of injectable contraceptive      Plan:   Next appointment due between Oct 30 and Nov 13.    Camelia Fetters, CMA [facility]

## 2024-02-18 ENCOUNTER — Other Ambulatory Visit: Payer: Self-pay

## 2024-02-18 MED ORDER — FLUZONE 0.5 ML IM SUSY
0.5000 mL | PREFILLED_SYRINGE | Freq: Once | INTRAMUSCULAR | 0 refills | Status: AC
  Filled 2024-02-18: qty 0.5, 1d supply, fill #0

## 2024-02-22 ENCOUNTER — Other Ambulatory Visit: Payer: Self-pay

## 2024-02-25 ENCOUNTER — Encounter: Payer: Self-pay | Admitting: Radiology

## 2024-02-29 NOTE — Progress Notes (Signed)
 PCP:  Sharma Coyer, MD   Chief Complaint  Patient presents with   Gynecologic Exam    And depo inj.    Urinary Tract Infection    Urinary frequency and right side low back pain x 2 weeks.     HPI:      [name] is a 43 y.o. G2P0202 whose LMP was No LMP recorded. Patient has had an injection., presents today for her annual examination.  Her menses are absent due to depo, no BTB/dysmen.   Sex activity: not sexually active. No vag sx.   Last Pap: 11/28/21 Results were: no abnormalities /neg HPV DNA   Last mammogram: 12/19/22 Results were: normal--routine follow-up in 12 months There is a FH of breast cancer in her MGM. There is also uterine cancer in her mom and colon cancer in her mat aunt. There is a FH of ovarian cancer in her pat aunt. The patient does do self-breast exams. Pt is MyRisk neg except NTHL1 VUS 8/23; IBIS=15.2%/riskscore=16.9%   Tobacco use: quit cigs, now vaping daily Alcohol use: none No drug use.  Exercise: min active  She does get adequate calcium; was on Rx Vitamin D  with PCP for Vit D deficiency, now just taking MVI. Labs with PCP  Notes urinary frequency (voiding Q 1 1/2 hrs) with and without good flow for the past 2 wks. Drinking water, no caffeine. Has to hold it for 7 hrs every other Sat due to job and then has frequency afterwards. Occas has urine odor, no dysuria but feels pressure and LBP when holds it. No hematuria/fevers. No vag sx.   Patient Active Problem List   Diagnosis Date Noted   Acute leg pain, left 06/01/2023   Subcutaneous nodule of back 06/01/2023   Primary hypertension 04/19/2023   Impaired glucose regulation 04/19/2023   Annual physical exam 10/26/2021   Vitamin D  deficiency 08/20/2017    Past Surgical History:  Procedure Laterality Date   CESAREAN SECTION  2002,2012   TUBAL LIGATION  07/28/2010    Family History  Problem Relation Age of Onset   Hypertension Mother    Uterine cancer Mother 70        spread to stomach, kidneys   Hyperlipidemia Father    Hypertension Father    Lupus Sister    Colon cancer Maternal Aunt 59   Ovarian cancer Paternal Aunt    Breast cancer Maternal Grandmother        48   Diabetes Paternal Grandmother    Dementia Paternal Grandfather    Alzheimer's disease Paternal Grandfather     Social History   Socioeconomic History   Marital status: Single    Spouse name: Not on file   Number of children: Not on file   Years of education: Not on file   Highest education level: Some college, no degree  Occupational History   Not on file  Tobacco Use   Smoking status: Former    Current packs/day: 0.00    Types: Cigarettes    Quit date: 12/06/2021    Years since quitting: 2.2    Passive exposure: Past   Smokeless tobacco: Never   Tobacco comments:    5 cigs/day; encourage use of lozenge/gum  Vaping Use   Vaping status: Every Day  Substance and Sexual Activity   Alcohol use: No   Drug use: No   Sexual activity: Not Currently    Birth control/protection: Injection, Surgical    Comment: Tubal Ligation  Other Topics  Concern   Not on file  Social History Narrative   Not on file   Social Drivers of Health   Financial Resource Strain: Medium Risk (04/15/2023)   Overall Financial Resource Strain (CARDIA)    Difficulty of Paying Living Expenses: Somewhat hard  Food Insecurity: Food Insecurity Present (04/15/2023)   Hunger Vital Sign    Worried About Running Out of Food in the Last Year: Sometimes true    Ran Out of Food in the Last Year: Sometimes true  Transportation Needs: No Transportation Needs (04/15/2023)   PRAPARE - Administrator, Civil Service (Medical): No    Lack of Transportation (Non-Medical): No  Physical Activity: Insufficiently Active (04/15/2023)   Exercise Vital Sign    Days of Exercise per Week: 3 days    Minutes of Exercise per Session: 10 min  Stress: Stress Concern Present (04/15/2023)   [employer] of  Occupational Health - Occupational Stress Questionnaire    Feeling of Stress : To some extent  Social Connections: Unknown (04/15/2023)   Social Connection and Isolation Panel    Frequency of Communication with Friends and Family: More than three times a week    Frequency of Social Gatherings with Friends and Family: Once a week    Attends Religious Services: Patient declined    Database Administrator or Organizations: No    Attends Engineer, Structural: Not on file    Marital Status: Divorced  Intimate Partner Violence: Not on file    No current outpatient medications on file.  Current Facility-Administered Medications:    medroxyPROGESTERone  (DEPO-PROVERA ) injection 150 mg, 150 mg, Intramuscular, Q90 days,      ROS:  Review of Systems  Constitutional:  Negative for fatigue, fever and unexpected weight change.  Respiratory:  Negative for cough, shortness of breath and wheezing.   Cardiovascular:  Negative for chest pain, palpitations and leg swelling.  Gastrointestinal:  Negative for blood in stool, constipation, diarrhea, nausea and vomiting.  Endocrine: Negative for cold intolerance, heat intolerance and polyuria.  Genitourinary:  Positive for frequency. Negative for dyspareunia, dysuria, flank pain, genital sores, hematuria, menstrual problem, pelvic pain, urgency, vaginal bleeding, vaginal discharge and vaginal pain.  Musculoskeletal:  Negative for back pain, joint swelling and myalgias.  Skin:  Negative for rash.  Neurological:  Negative for dizziness, syncope, light-headedness, numbness and headaches.  Hematological:  Negative for adenopathy.  Psychiatric/Behavioral:  Negative for agitation, confusion, sleep disturbance and suicidal ideas. The patient is not nervous/anxious.    BREAST: No symptoms   Objective: BP 123/74   Pulse 91   Ht 5' 9 (1.753 m)   Wt 218 lb (98.9 kg)   BMI 32.19 kg/m    Physical Exam Constitutional:      Appearance: She is  well-developed.  Genitourinary:     Vulva normal.     Right Labia: No rash, tenderness or lesions.    Left Labia: No tenderness, lesions or rash.    No vaginal discharge, erythema or tenderness.      Right Adnexa: not tender and no mass present.    Left Adnexa: not tender and no mass present.    No cervical friability or polyp.     Uterus is not enlarged or tender.  Breasts:    Right: No mass, nipple discharge, skin change or tenderness.     Left: No mass, nipple discharge, skin change or tenderness.  Neck:     Thyroid : No thyromegaly.  Cardiovascular:  Rate and Rhythm: Normal rate and regular rhythm.     Heart sounds: Normal heart sounds. No murmur heard. Pulmonary:     Effort: Pulmonary effort is normal.     Breath sounds: Normal breath sounds.  Abdominal:     Palpations: Abdomen is soft.     Tenderness: There is no abdominal tenderness. There is no guarding or rebound.  Musculoskeletal:        General: Normal range of motion.     Cervical back: Normal range of motion.  Lymphadenopathy:     Cervical: No cervical adenopathy.  Neurological:     General: No focal deficit present.     Mental Status: She is alert and oriented to person, place, and time.     Cranial Nerves: No cranial nerve deficit.  Skin:    General: Skin is warm and dry.  Psychiatric:        Mood and Affect: Mood normal.        Behavior: Behavior normal.        Thought Content: Thought content normal.        Judgment: Judgment normal.  Vitals reviewed.    Results for orders placed or performed in visit on 03/03/24 (from the past 24 hours)  POCT Urinalysis Dipstick     Status: Normal   Collection Time: 03/03/24  2:01 PM  Result Value Ref Range   Color, UA yellow    Clarity, UA cloudy    Glucose, UA Negative Negative   Bilirubin, UA neg    Ketones, UA neg    Spec Grav, UA 1.020 1.010 - 1.025   Blood, UA trace    pH, UA 5.0 5.0 - 8.0   Protein, UA Negative Negative   Urobilinogen, UA      Nitrite, UA neg    Leukocytes, UA Negative Negative   Appearance     Odor       Assessment/Plan: Encounter for annual routine gynecological examination  Encounter for surveillance of injectable contraceptive - Plan: medroxyPROGESTERone  Acetate SUSY 150 mg, medroxyPROGESTERone  (DEPO-PROVERA ) injection 150 mg; cont depo, aware of slight risk of meningioma. Cont ca/Vit D.   Encounter for screening mammogram for malignant neoplasm of breast - Plan: MM 3D SCREENING MAMMOGRAM BILATERAL BREAST; pt to schedule mammo  Family history of breast cancer - Plan: MM 3D SCREENING MAMMOGRAM BILATERAL BREAST; neg MyRisk testing, no increased screening options  UTI symptoms - Plan: POCT Urinalysis Dipstick, Urine Culture; essentially neg UA, check C&S. If neg, reassurance. Voiding Q1 1/2-2 hrs is normal and increased voiding after holding all day is also normal.    Meds ordered this encounter  Medications   medroxyPROGESTERone  Acetate SUSY 150 mg   medroxyPROGESTERone  (DEPO-PROVERA ) injection 150 mg             GYN counsel breast self exam, mammography screening, adequate intake of calcium and vitamin D , diet and exercise     F/U  Return in about 1 year (around 03/03/2025).  Ashleyanne Hemmingway B. Chiyeko Ferre, PA-C 03/03/2024 2:02 PM

## 2024-03-03 ENCOUNTER — Ambulatory Visit (INDEPENDENT_AMBULATORY_CARE_PROVIDER_SITE_OTHER): Admitting: Obstetrics and Gynecology

## 2024-03-03 ENCOUNTER — Encounter: Payer: Self-pay | Admitting: Obstetrics and Gynecology

## 2024-03-03 VITALS — BP 123/74 | HR 91 | Ht 69.0 in | Wt 218.0 lb

## 2024-03-03 DIAGNOSIS — Z01411 Encounter for gynecological examination (general) (routine) with abnormal findings: Secondary | ICD-10-CM | POA: Diagnosis not present

## 2024-03-03 DIAGNOSIS — Z01419 Encounter for gynecological examination (general) (routine) without abnormal findings: Secondary | ICD-10-CM

## 2024-03-03 DIAGNOSIS — Z803 Family history of malignant neoplasm of breast: Secondary | ICD-10-CM | POA: Diagnosis not present

## 2024-03-03 DIAGNOSIS — R399 Unspecified symptoms and signs involving the genitourinary system: Secondary | ICD-10-CM

## 2024-03-03 DIAGNOSIS — Z1231 Encounter for screening mammogram for malignant neoplasm of breast: Secondary | ICD-10-CM

## 2024-03-03 DIAGNOSIS — Z3042 Encounter for surveillance of injectable contraceptive: Secondary | ICD-10-CM | POA: Diagnosis not present

## 2024-03-03 LAB — POCT URINALYSIS DIPSTICK
Bilirubin, UA: NEGATIVE
Glucose, UA: NEGATIVE
Ketones, UA: NEGATIVE
Leukocytes, UA: NEGATIVE
Nitrite, UA: NEGATIVE
Protein, UA: NEGATIVE
Spec Grav, UA: 1.02 (ref 1.010–1.025)
pH, UA: 5 (ref 5.0–8.0)

## 2024-03-03 MED ORDER — MEDROXYPROGESTERONE ACETATE 150 MG/ML IM SUSY
150.0000 mg | PREFILLED_SYRINGE | Freq: Once | INTRAMUSCULAR | Status: AC
  Administered 2024-03-03: 150 mg via INTRAMUSCULAR

## 2024-03-03 MED ORDER — MEDROXYPROGESTERONE ACETATE 150 MG/ML IM SUSP: 150.0000 mg | INTRAMUSCULAR | Status: AC

## 2024-03-03 NOTE — Patient Instructions (Signed)
 I value your feedback and you entrusting [place] with your care. If you get a [facility] patient survey, I would appreciate you taking the time to let us know about your experience today. Thank you!  [facility] ([place])--[phone]

## 2024-03-05 LAB — URINE CULTURE

## 2024-05-26 ENCOUNTER — Ambulatory Visit

## 2024-05-28 ENCOUNTER — Ambulatory Visit

## 2024-05-28 VITALS — BP 114/80 | HR 96 | Ht 69.0 in | Wt 223.9 lb

## 2024-05-28 DIAGNOSIS — Z3042 Encounter for surveillance of injectable contraceptive: Secondary | ICD-10-CM

## 2024-05-28 MED ORDER — MEDROXYPROGESTERONE ACETATE 150 MG/ML IM SUSP
150.0000 mg | Freq: Once | INTRAMUSCULAR | Status: AC
  Administered 2024-05-28: 150 mg via INTRAMUSCULAR

## 2024-05-28 NOTE — Progress Notes (Signed)
" ° ° °  NURSE VISIT NOTE  Subjective:    Patient ID: MARELY APGAR, female    DOB: Jan 22, 1981, 44 y.o.   MRN: [id number]  HPI  Patient is a 44 y.o. G10P0202 female who presents for depo provera  injection.   Objective:    BP 114/80   Pulse 96   Ht 5' 9 (1.753 m)   Wt 223 lb 14.4 oz (101.6 kg)   BMI 33.06 kg/m   Last Annual: 03/03/24. Last pap: 11/28/21. Last Depo-Provera : 03/03/24. Side Effects if any: none. Serum HCG indicated? No . Depo-Provera  150 mg IM given by: Mathis Getting, CMA. Site: Right Upper Outer Quandrant  Lab Review  No results found for any visits on 05/28/24.  Assessment:   1. Encounter for management and injection of depo-Provera       Plan:   Next appointment due between 08/11/24 and 08/25/2024.    Mathis LITTIE Getting, CMA  "

## 2024-05-28 NOTE — Patient Instructions (Signed)
 Medroxyprogesterone  Injection (Contraception) What is this medication? MEDROXYPROGESTERONE  (me DROX ee proe JES te rone) prevents ovulation and pregnancy. It belongs to a group of medications called contraceptives. This medication is a progestin hormone. This medicine may be used for other purposes; ask your health care provider or pharmacist if you have questions. COMMON BRAND NAME(S): Depo-Provera , Depo-subQ Provera  104 What should I tell my care team before I take this medication? They need to know if you have any of these conditions: Asthma Blood clots Breast cancer or family history of breast cancer Depression Diabetes Eating disorder (anorexia nervosa) Frequently drink alcohol Heart attack High blood pressure HIV infection or AIDS Kidney disease Liver disease Migraine headaches Osteoporosis, weak bones Seizures Stroke Tobacco use Vaginal bleeding An unusual or allergic reaction to medroxyprogesterone , other medications, foods, dyes, or preservatives Pregnant or trying to get pregnant Breast-feeding How should I use this medication? Depo-Provera  CI contraceptive injection is given into a muscle. Depo-subQ Provera  104 injection is given under the skin. It is given in a hospital or clinic setting. The injection is usually given during the first 5 days after the start of a menstrual period or 6 weeks after delivery of a baby. A patient package insert for the product will be given with each prescription and refill. Be sure to read this information carefully each time. The sheet may change often. Talk to your care team about the use of this medication in children. Special care may be needed. These injections have been used in female children who have started having menstrual periods. Overdosage: If you think you have taken too much of this medicine contact a poison control center or emergency room at once. NOTE: This medicine is only for you. Do not share this medicine with  others. What if I miss a dose? Keep appointments for follow-up doses. You must get an injection once every 3 months. It is important not to miss your dose. Call your care team if you are unable to keep an appointment. What may interact with this medication? Antibiotics or medications for infections, especially rifampin and griseofulvin Antivirals for HIV or hepatitis Aprepitant Armodafinil Bexarotene Bosentan Medications for seizures, such as carbamazepine, felbamate, oxcarbazepine, phenytoin, phenobarbital, primidone, topiramate Mitotane Modafinil St. John's Wort This list may not describe all possible interactions. Give your health care provider a list of all the medicines, herbs, non-prescription drugs, or dietary supplements you use. Also tell them if you smoke, drink alcohol, or use illegal drugs. Some items may interact with your medicine. What should I watch for while using this medication? Visit your care team for regular health checks while on this medication. Using this medication does not protect you or your partner against HIV or other sexually transmitted infections (STIs). You may need to use another form of contraception, such as a condom, when you first start taking this medication. This is called backup contraception. This helps prevent pregnancy until your medication has had time to reach its full effect. Talk to your care team. They can help you find the option that works best for you. They can also tell you how long you may need backup contraception. This medication can decrease the amount of calcium in your bones. This weakens your bones and increases the risk of fractures. The risk increases the longer you take this medication. This effect is not reversible. Do not take this medication for more than 2 years unless you are not able to use other forms of contraception. Your care team can help you  find the option that works best for you. They can also help you maintain your bone  health. This medication may change your menstrual cycle pattern. You may have irregular menstrual cycles or spotting, an increase or decrease in bleeding, or no bleeding at all. You may skip periods or your periods may stop. This is normal. If you think you may be pregnant, talk to your care team. Talk to your care team if you plan to get pregnant within the next year. The effect of this medication may last a long time after you get your last injection. What side effects may I notice from receiving this medication? Side effects that you should report to your care team as soon as possible: Allergic reactions--skin rash, itching, hives, swelling of the face, lips, tongue, or throat Blood clot--pain, swelling, or warmth in the leg, shortness of breath, chest pain Gallbladder problems--severe stomach pain, nausea, vomiting, fever Increase in blood pressure Liver injury--right upper belly pain, loss of appetite, nausea, light-colored stool, dark yellow or brown urine, yellowing skin or eyes, unusual weakness or fatigue New or worsening migraines or headaches Seizures Stroke--sudden numbness or weakness of the face, arm, or leg, trouble speaking, confusion, trouble walking, loss of balance or coordination, dizziness, severe headache, change in vision Unusual vaginal discharge, itching, or odor Worsening mood, feelings of depression Side effects that usually do not require medical attention (report to your care team if they continue or are bothersome): Breast pain or tenderness Dark patches of the skin on the face or other sun-exposed areas Irregular menstrual cycles or spotting Nausea Weight gain This list may not describe all possible side effects. Call your doctor for medical advice about side effects. You may report side effects to FDA at 1-800-FDA-1088. Where should I keep my medication? This injection is only given by a care team. It will not be stored at home. NOTE: This sheet is a summary.  It may not cover all possible information. If you have questions about this medicine, talk to your doctor, pharmacist, or health care provider.  2025 Elsevier/Gold Standard (2023-11-26 00:00:00)

## 2024-05-29 ENCOUNTER — Ambulatory Visit
Admission: RE | Admit: 2024-05-29 | Discharge: 2024-05-29 | Disposition: A | Source: Ambulatory Visit | Attending: Obstetrics and Gynecology

## 2024-05-29 DIAGNOSIS — Z1231 Encounter for screening mammogram for malignant neoplasm of breast: Secondary | ICD-10-CM

## 2024-05-29 DIAGNOSIS — Z803 Family history of malignant neoplasm of breast: Secondary | ICD-10-CM

## 2024-08-20 ENCOUNTER — Ambulatory Visit
